# Patient Record
Sex: Male | Born: 2000 | Race: Black or African American | Hispanic: No | Marital: Single | Smoking: Never smoker
Health system: Southern US, Community
[De-identification: ages and names within clinical notes are randomized; demographics above are authoritative.]

## PROBLEM LIST (undated history)

## (undated) DIAGNOSIS — F909 Attention-deficit hyperactivity disorder, unspecified type: Secondary | ICD-10-CM

## (undated) DIAGNOSIS — S069X9A Unspecified intracranial injury with loss of consciousness of unspecified duration, initial encounter: Secondary | ICD-10-CM

## (undated) DIAGNOSIS — R531 Weakness: Secondary | ICD-10-CM

## (undated) DIAGNOSIS — S069XAA Unspecified intracranial injury with loss of consciousness status unknown, initial encounter: Secondary | ICD-10-CM

## (undated) DIAGNOSIS — S0990XS Unspecified injury of head, sequela: Secondary | ICD-10-CM

## (undated) DIAGNOSIS — R569 Unspecified convulsions: Secondary | ICD-10-CM

## (undated) DIAGNOSIS — G8190 Hemiplegia, unspecified affecting unspecified side: Secondary | ICD-10-CM

## (undated) HISTORY — DX: Hemiplegia, unspecified affecting unspecified side: S09.90XS

## (undated) HISTORY — DX: Hemiplegia, unspecified affecting unspecified side: G81.90

## (undated) HISTORY — PX: EYE SURGERY: SHX253

## (undated) HISTORY — DX: Attention-deficit hyperactivity disorder, unspecified type: F90.9

## (undated) HISTORY — PX: STRABISMUS SURGERY: SHX218

## (undated) HISTORY — DX: Unspecified injury of head, sequela: S09.90XS

---
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
## 2019-01-21 ENCOUNTER — Ambulatory Visit (INDEPENDENT_AMBULATORY_CARE_PROVIDER_SITE_OTHER): Payer: BC Managed Care – PPO | Admitting: Mental Health

## 2019-01-21 DIAGNOSIS — F0281 Dementia in other diseases classified elsewhere with behavioral disturbance: Secondary | ICD-10-CM

## 2019-01-21 DIAGNOSIS — F908 Attention-deficit hyperactivity disorder, other type: Secondary | ICD-10-CM

## 2019-01-21 DIAGNOSIS — S069X9A Unspecified intracranial injury with loss of consciousness of unspecified duration, initial encounter: Secondary | ICD-10-CM

## 2019-01-21 DIAGNOSIS — IMO0001 Reserved for inherently not codable concepts without codable children: Secondary | ICD-10-CM

## 2019-01-21 NOTE — Progress Notes (Signed)
[facility] Counselor Initial Adult Exam  Name: Jonathan Moody Date: 01/21/2019 MRN: [id number] DOB: [birth year] PCP: Dahlia Byes, MD  Time spent: 55 minutes Guardian: Adoptive parents: Gavin Pound - mother;   Virtual Visit via Telephone Note Connected with patient by a video enabled telemedicine/telehealth application or telephone, with their informed consent, and verified patient privacy and that I am speaking with the correct person using two identifiers. I discussed the limitations, risks, security and privacy concerns of performing psychotherapy and management service by telephone and the availability of in person appointments. I also discussed with the patient that there may be a patient responsible charge related to this service. The patient expressed understanding and agreed to proceed. I discussed the treatment planning with the patient. The patient was provided an opportunity to ask questions and all were answered. The patient agreed with the plan and demonstrated an understanding of the instructions. The patient was advised to call  our office if  symptoms worsen or feel they are in a crisis state and need immediate contact.  Therapist Location: home Patient Location: home  Start time: 3:00pm Stop time: 3:55pm   Reason for Visit /Presenting Problem:  Mother -Gavin Pound participated in this assessment via telephone. Pt has a hx of speech, occupational, and PT. TBI since age 45 months.   Per record review, Pt had presented to the ED for suicidal ideation in the context of command auditory hallucinations. Mother denies observing Pt having AVH.   Pt has "problems w/ personal space", mother stated he is age 18, but on a 1st grade reading level. Pt can communicate, may lack some abstraction. Becoming interested in girls.  Parents want him to improve social skills and improve judgement / decision making. Eg. He may wander away from them, or the home; she wonders if this is just going to an  ongoing  Problem due to his cognitive functioning. Pt struggles w/ any schedule change (related to TBI/Dev Delay). He has a girl friend in his Conservation officer, historic buildings (at [employer]), they are no longer friends and this has upset Pt. This was about a month or so ago. Pt to receive a Certificate of Completion. Pt is reactive to being surprised- if someone touches him, he gets upset.  Pt did not comment during assessment. Info all provided by mother.  Mental Status Exam:   Appearance:   UTA (unable to assess)  Behavior:  UTA  Motor:  UTA  Speech/Language:   UTA; Delayed per mother  Affect:  UTA  Mood:  UTA; "Calm"  (per mother)  Thought process:  UTA  Thought content:    UTA  Sensory/Perceptual disturbances:    UTA; Denied by guardian   Orientation:  UTA  Attention:  UTA  Concentration:  poor  Memory:  Short /long term intact per mother  Fund of knowledge:   Poor  Insight:    poor  Judgment:   Poor  Impulse Control:  Poor   Reported Symptoms:   Impulsivity, distractible, concentration deficits, social deficits, communication deficits, sleep disturbance  Risk Assessment: Danger to Self:  No Self-injurious Behavior: No Danger to Others: No Duty to Warn:no Physical Aggression / Violence:No  Access to Firearms a concern: No  Gang Involvement:No  Patient / guardian was educated about steps to take if suicide or homicide risk level increases between visits:  While future psychiatric events cannot be accurately predicted, the patient does not currently require acute inpatient psychiatric care and does not currently meet [facility] involuntary commitment criteria.  Substance Abuse History: Current substance abuse: none  Family History:  Family History  Problem Relation Age of Onset  . Epilepsy Maternal Grandfather    Medical History/Surgical History: Past Medical History:  Diagnosis Date  . ADHD (attention deficit hyperactivity disorder)   . Left-sided weakness   . Seizure  ([facility])   . TBI (traumatic brain injury) [facility])     Past Surgical History:  Procedure Laterality Date  . EYE SURGERY      Medications: Current Outpatient Medications  Medication Sig Dispense Refill  . Docusate Sodium (STOOL SOFTENER) 100 MG capsule Take 100 mg by mouth 2 (two) times daily as needed for constipation.    . LamoTRIgine (LAMICTAL XR) 25 MG TB24 24 hour tablet Take 1 tablet total 25 mg by mouth at bedtime for 2 weeks then 2 tablets total 50 mg at bedtime thereafter 50 tablet 1  . lisdexamfetamine (VYVANSE) 60 MG capsule Take 60 mg by mouth daily.    . Multiple Vitamins-Minerals (MULTIVITAMIN ADULT PO) Take 1 tablet by mouth daily.     No current facility-administered medications for this visit.     No Known Allergies  Diagnoses:    ICD-10-CM   1. Attention deficit hyperactivity disorder (ADHD), other type F90.8   2. 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  Patient to continue to engage in individual counseling 2-4 times a month or as needed. 2.  Patient to complete assessment next session. 3.  Patient to contact this office, go to the local ED or call 911 if a crisis or emergency develops between visits.   Waldron Session, [facility]

## 2019-01-27 ENCOUNTER — Ambulatory Visit (INDEPENDENT_AMBULATORY_CARE_PROVIDER_SITE_OTHER): Payer: BC Managed Care – PPO | Admitting: Psychiatry

## 2019-01-27 ENCOUNTER — Other Ambulatory Visit: Payer: Self-pay

## 2019-01-27 ENCOUNTER — Encounter: Payer: Self-pay | Admitting: Psychiatry

## 2019-01-27 DIAGNOSIS — F0281 Dementia in other diseases classified elsewhere with behavioral disturbance: Secondary | ICD-10-CM

## 2019-01-27 DIAGNOSIS — F908 Attention-deficit hyperactivity disorder, other type: Secondary | ICD-10-CM

## 2019-01-27 DIAGNOSIS — S069X9D Unspecified intracranial injury with loss of consciousness of unspecified duration, subsequent encounter: Secondary | ICD-10-CM

## 2019-01-27 DIAGNOSIS — IMO0001 Reserved for inherently not codable concepts without codable children: Secondary | ICD-10-CM

## 2019-01-27 MED ORDER — LAMOTRIGINE ER 100 MG PO TB24: 100.0000 mg | ORAL_TABLET | Freq: Every day | ORAL | 1 refills | Status: DC
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
## 2019-03-27 ENCOUNTER — Other Ambulatory Visit: Payer: Self-pay | Admitting: Psychiatry

## 2019-03-27 DIAGNOSIS — F0281 Dementia in other diseases classified elsewhere with behavioral disturbance: Secondary | ICD-10-CM

## 2019-03-27 DIAGNOSIS — IMO0001 Reserved for inherently not codable concepts without codable children: Secondary | ICD-10-CM

## 2019-03-27 DIAGNOSIS — F908 Attention-deficit hyperactivity disorder, other type: Secondary | ICD-10-CM

## 2019-05-22 ENCOUNTER — Other Ambulatory Visit: Payer: Self-pay | Admitting: Psychiatry

## 2019-05-22 DIAGNOSIS — F908 Attention-deficit hyperactivity disorder, other type: Secondary | ICD-10-CM

## 2019-05-22 DIAGNOSIS — IMO0001 Reserved for inherently not codable concepts without codable children: Secondary | ICD-10-CM

## 2019-05-22 DIAGNOSIS — F0281 Dementia in other diseases classified elsewhere with behavioral disturbance: Secondary | ICD-10-CM

## 2019-05-24 NOTE — Telephone Encounter (Signed)
Neurocognitive disorder with intellectual disability cared for by grandparents all ambivalent at 2nd appointment in June about his treatment but allowing increased dose and apparent improvement warranting their request for refills reminding 2 months overdue for office follow-up on 30-day supply with [facility] [address]

## 2019-05-24 NOTE — Telephone Encounter (Signed)
Last visit 01/2019

## 2019-06-27 ENCOUNTER — Other Ambulatory Visit: Payer: Self-pay | Admitting: Psychiatry

## 2019-06-27 DIAGNOSIS — IMO0001 Reserved for inherently not codable concepts without codable children: Secondary | ICD-10-CM

## 2019-06-27 DIAGNOSIS — F908 Attention-deficit hyperactivity disorder, other type: Secondary | ICD-10-CM

## 2019-06-27 DIAGNOSIS — F0281 Dementia in other diseases classified elsewhere with behavioral disturbance: Secondary | ICD-10-CM

## 2019-06-29 NOTE — Telephone Encounter (Signed)
Now 3 months overdue for follow-up from 74-month appointment planned after April appointment as they request to continue the Lamictal 100 mg every morning sent as #30 with no refill to [facility] [address] medically necessary with no contraindication as of last appointment reminder requested.

## 2019-06-29 NOTE — Telephone Encounter (Signed)
Still hasn't scheduled appt

## 2019-07-27 ENCOUNTER — Other Ambulatory Visit: Payer: Self-pay | Admitting: Psychiatry

## 2019-07-27 DIAGNOSIS — S069X9D Unspecified intracranial injury with loss of consciousness of unspecified duration, subsequent encounter: Secondary | ICD-10-CM

## 2019-07-27 DIAGNOSIS — F908 Attention-deficit hyperactivity disorder, other type: Secondary | ICD-10-CM

## 2019-07-27 DIAGNOSIS — F0281 Dementia in other diseases classified elsewhere with behavioral disturbance: Secondary | ICD-10-CM

## 2019-07-27 DIAGNOSIS — IMO0001 Reserved for inherently not codable concepts without codable children: Secondary | ICD-10-CM

## 2019-07-27 NOTE — Telephone Encounter (Signed)
appt 01/27/2019 nothing scheduled

## 2019-07-27 NOTE — Telephone Encounter (Signed)
4 months overdue for follow-up, the patient would appear to have been off the medication for a couple of months and likely restarted Lamictal as they continue to request fills without appointment, reminders not helping.  Quantity is reduced as they must either use the medication under agreed-upon parameters of care or secure provider or interval in which they can become compliant, sent as #20 tablets of the Lamictal 100 mg daily
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
## 2019-08-04 ENCOUNTER — Other Ambulatory Visit: Payer: Self-pay

## 2019-08-04 DIAGNOSIS — F908 Attention-deficit hyperactivity disorder, other type: Secondary | ICD-10-CM

## 2019-08-04 DIAGNOSIS — IMO0001 Reserved for inherently not codable concepts without codable children: Secondary | ICD-10-CM

## 2019-08-04 DIAGNOSIS — F0281 Dementia in other diseases classified elsewhere with behavioral disturbance: Secondary | ICD-10-CM

## 2019-08-04 MED ORDER — LAMOTRIGINE ER 100 MG PO TB24: 100.0000 mg | ORAL_TABLET | Freq: Every day | ORAL | 0 refills | Status: DC

## 2019-08-04 NOTE — Telephone Encounter (Signed)
Phone call can reach adoptive mother patient having neurocognitive disorder for which Lamictal has reportedly been beneficial now 4 months overdue for follow-up agreed-upon. Pharmacy requests multiple repeated refills without monitoring of use medically.  He did have Vyvanse dispensed 07/21/2019, and we discuss today whether Dr. Dennard Schaumann might also monitor and prescribe his Lamictal. Mother does not want change suggesting she might allow a virtual appointment or even consider an onsite appointment having 1 of each in the past but none for 6 months.  [facility]-day [facility] with expectation that appointment can determine medical status and continuance for maintenance schedule if they do not switch the Lamictal over to Dr. Dennard Schaumann.

## 2019-08-12 ENCOUNTER — Ambulatory Visit (INDEPENDENT_AMBULATORY_CARE_PROVIDER_SITE_OTHER): Payer: BC Managed Care – PPO | Admitting: Psychiatry

## 2019-08-12 ENCOUNTER — Encounter: Payer: Self-pay | Admitting: Psychiatry

## 2019-08-12 ENCOUNTER — Other Ambulatory Visit: Payer: Self-pay

## 2019-08-12 VITALS — Ht 70.5 in | Wt 154.0 lb

## 2019-08-12 DIAGNOSIS — F900 Attention-deficit hyperactivity disorder, predominantly inattentive type: Secondary | ICD-10-CM | POA: Diagnosis not present

## 2019-08-12 DIAGNOSIS — F0281 Dementia in other diseases classified elsewhere with behavioral disturbance: Secondary | ICD-10-CM | POA: Diagnosis not present

## 2019-08-12 DIAGNOSIS — F908 Attention-deficit hyperactivity disorder, other type: Secondary | ICD-10-CM

## 2019-08-12 DIAGNOSIS — IMO0001 Reserved for inherently not codable concepts without codable children: Secondary | ICD-10-CM

## 2019-08-12 MED ORDER — LAMOTRIGINE ER 100 MG PO TB24: 100.0000 mg | ORAL_TABLET | Freq: Every day | ORAL | 5 refills | Status: DC
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
## 2019-09-10 ENCOUNTER — Telehealth: Payer: Self-pay | Admitting: Psychiatry

## 2019-09-10 NOTE — Telephone Encounter (Signed)
Mom, Hilda Blades, called wanting to discuss medication.  She is following up from conversation about medication at last appt.  Please call [phone])

## 2019-09-10 NOTE — Telephone Encounter (Signed)
Neoma Laming as mother phones that patient is aging out of pediatric practice so that pediatrician suggest that the Vyvanse 60 mg be through this office.  Mother just wants to confirm that she can request a monthly supply of [facility] when it is time for that planning to let the office know herself.

## 2019-09-15 ENCOUNTER — Other Ambulatory Visit: Payer: Self-pay | Admitting: Psychiatry

## 2019-09-15 DIAGNOSIS — F900 Attention-deficit hyperactivity disorder, predominantly inattentive type: Secondary | ICD-10-CM

## 2019-09-15 MED ORDER — LISDEXAMFETAMINE DIMESYLATE 60 MG PO CAPS: 60.0000 mg | ORAL_CAPSULE | Freq: Every day | ORAL | 0 refills | Status: DC

## 2019-09-15 NOTE — Telephone Encounter (Signed)
2 days before Thanksgiving now the [facility] registry documents next Vyvanse filled is due 09/18/2019 sent today due to the holiday medically necessary no contraindication

## 2019-09-15 NOTE — Telephone Encounter (Signed)
Mom Jonathan Moody called stating she would like the Vyvanse 60mg  filled at the [facility] per previous conversation with Dr.Jennings. Last appt 10/21s

## 2019-09-15 NOTE — Telephone Encounter (Signed)
Primary care requests transfer of Vyvanse E scribing to this office as patient ages out there sending Vyvanse 60 mg every morning #30 with [facility] refill [facility] medically necessary no contraindication

## 2019-10-19 ENCOUNTER — Telehealth: Payer: Self-pay | Admitting: Psychiatry

## 2019-10-19 ENCOUNTER — Other Ambulatory Visit: Payer: Self-pay

## 2019-10-19 DIAGNOSIS — F900 Attention-deficit hyperactivity disorder, predominantly inattentive type: Secondary | ICD-10-CM

## 2019-10-19 MED ORDER — LISDEXAMFETAMINE DIMESYLATE 60 MG PO CAPS: 60.0000 mg | ORAL_CAPSULE | Freq: Every day | ORAL | 0 refills | Status: DC

## 2019-10-19 NOTE — Telephone Encounter (Signed)
Last refill 09/19/2019 Pended for approval by Dr. Creig Hines Due back in April 2021 for 6 month follow up

## 2019-10-19 NOTE — Telephone Encounter (Signed)
At last appointment 08/19/2019, they did address Lamictal as well as Vyvanse phoning back 11/19 that patient was aging out of Dr. Charisse March practice sending E scription for Vyvanse first on 11/24 dispensed per [place] registry on 11/28 now due the next fill [facility] of the 60 mg Vyvanse medically necessary no contraindication

## 2019-10-19 NOTE — Telephone Encounter (Signed)
Pt mom requested refill for Vyvance @ [facility]. Running behind on requesting due to holiday and office not opened.

## 2019-11-16 ENCOUNTER — Telehealth: Payer: Self-pay | Admitting: Psychiatry

## 2019-11-16 DIAGNOSIS — F900 Attention-deficit hyperactivity disorder, predominantly inattentive type: Secondary | ICD-10-CM

## 2019-11-16 MED ORDER — LISDEXAMFETAMINE DIMESYLATE 60 MG PO CAPS: 60.0000 mg | ORAL_CAPSULE | Freq: Every day | ORAL | 0 refills | Status: DC

## 2019-11-16 NOTE — Telephone Encounter (Signed)
Last appointment 08/12/2019 needing in the interim to transfer Vyvanse refills from his pediatrician to his current care here last dispensed per [facility] registry 10/19/[facility] [address] sent again as #30 for January 27 today medically necessary no contraindication.

## 2019-11-16 NOTE — Telephone Encounter (Signed)
Patient's om called and said that he needs a refill on his vyvanse 60 mg [facility] [address]

## 2019-12-16 ENCOUNTER — Other Ambulatory Visit: Payer: Self-pay

## 2019-12-16 ENCOUNTER — Telehealth: Payer: Self-pay | Admitting: Psychiatry

## 2019-12-16 DIAGNOSIS — F900 Attention-deficit hyperactivity disorder, predominantly inattentive type: Secondary | ICD-10-CM

## 2019-12-16 MED ORDER — LISDEXAMFETAMINE DIMESYLATE 60 MG PO CAPS: 60.0000 mg | ORAL_CAPSULE | Freq: Every day | ORAL | 0 refills | Status: DC

## 2019-12-16 NOTE — Telephone Encounter (Signed)
Pt's mother called and stated Jonathan Moody needs refill on Vyvanse. [facility] [address]

## 2019-12-16 NOTE — Telephone Encounter (Signed)
Last refill 11/19/2019, pended for Dr. Marlyne Beards to submit. Last apt 08/12/2019

## 2020-01-08 ENCOUNTER — Other Ambulatory Visit: Payer: Self-pay | Admitting: Psychiatry

## 2020-01-08 DIAGNOSIS — IMO0001 Reserved for inherently not codable concepts without codable children: Secondary | ICD-10-CM

## 2020-01-08 DIAGNOSIS — F908 Attention-deficit hyperactivity disorder, other type: Secondary | ICD-10-CM

## 2020-01-08 DIAGNOSIS — F0281 Dementia in other diseases classified elsewhere with behavioral disturbance: Secondary | ICD-10-CM

## 2020-01-14 ENCOUNTER — Other Ambulatory Visit: Payer: Self-pay

## 2020-01-14 ENCOUNTER — Telehealth: Payer: Self-pay | Admitting: Psychiatry

## 2020-01-14 DIAGNOSIS — F900 Attention-deficit hyperactivity disorder, predominantly inattentive type: Secondary | ICD-10-CM

## 2020-01-14 MED ORDER — LISDEXAMFETAMINE DIMESYLATE 60 MG PO CAPS: 60.0000 mg | ORAL_CAPSULE | Freq: Every day | ORAL | 0 refills | Status: DC

## 2020-01-14 NOTE — Telephone Encounter (Signed)
Last refill 12/19/2019, pended for Dr. Marlyne Beards with appropriate dates.

## 2020-01-14 NOTE — Telephone Encounter (Signed)
[name] called on Jonathan Moody's behalf requesting a refill on his Vyvanse. [facility] [address] She stated he has about 5 days left, but didn't want to run completely out. Will call in the next 2wks to schedule an appt due to school schedule.

## 2020-01-27 ENCOUNTER — Telehealth: Payer: Self-pay | Admitting: Psychiatry

## 2020-01-27 NOTE — Telephone Encounter (Signed)
Prepared on letterhead as requested for pickup with consent with processing

## 2020-01-27 NOTE — Telephone Encounter (Signed)
Patient's mom called and said that she needs a letter for the school stating what medicines he is taking to update his file. Please call mom for pick up [phone]

## 2020-02-10 ENCOUNTER — Ambulatory Visit (INDEPENDENT_AMBULATORY_CARE_PROVIDER_SITE_OTHER): Payer: BC Managed Care – PPO | Admitting: Psychiatry

## 2020-02-10 ENCOUNTER — Other Ambulatory Visit: Payer: Self-pay

## 2020-02-10 ENCOUNTER — Encounter: Payer: Self-pay | Admitting: Psychiatry

## 2020-02-10 ENCOUNTER — Ambulatory Visit: Payer: BC Managed Care – PPO | Admitting: Psychiatry

## 2020-02-10 VITALS — Ht 70.5 in | Wt 153.0 lb

## 2020-02-10 DIAGNOSIS — F900 Attention-deficit hyperactivity disorder, predominantly inattentive type: Secondary | ICD-10-CM

## 2020-02-10 DIAGNOSIS — IMO0001 Reserved for inherently not codable concepts without codable children: Secondary | ICD-10-CM

## 2020-02-10 DIAGNOSIS — F0281 Dementia in other diseases classified elsewhere with behavioral disturbance: Secondary | ICD-10-CM | POA: Diagnosis not present

## 2020-02-10 MED ORDER — LISDEXAMFETAMINE DIMESYLATE 60 MG PO CAPS: 60.0000 mg | ORAL_CAPSULE | Freq: Every day | ORAL | 0 refills | Status: DC
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
## 2020-03-15 ENCOUNTER — Telehealth: Payer: Self-pay | Admitting: Psychiatry

## 2020-03-15 NOTE — Telephone Encounter (Signed)
Phone call returned to guardian that Vyvanse 60 mg eScription from April 21 appointment is available as #30 on May 27 and #30 on June 26 explaining that process again so she will feel confident the pharmacy has the escript to fill.

## 2020-03-15 NOTE — Telephone Encounter (Signed)
Requesting refill on Vyvanse 60 MG. Please send to [facility] [address]

## 2020-04-07 ENCOUNTER — Other Ambulatory Visit: Payer: Self-pay | Admitting: Psychiatry

## 2020-04-07 DIAGNOSIS — F908 Attention-deficit hyperactivity disorder, other type: Secondary | ICD-10-CM

## 2020-04-07 DIAGNOSIS — IMO0001 Reserved for inherently not codable concepts without codable children: Secondary | ICD-10-CM

## 2020-04-20 DIAGNOSIS — H53022 Refractive amblyopia, left eye: Secondary | ICD-10-CM | POA: Insufficient documentation

## 2020-05-06 ENCOUNTER — Other Ambulatory Visit: Payer: Self-pay | Admitting: Psychiatry

## 2020-05-06 DIAGNOSIS — F900 Attention-deficit hyperactivity disorder, predominantly inattentive type: Secondary | ICD-10-CM

## 2020-05-06 DIAGNOSIS — IMO0001 Reserved for inherently not codable concepts without codable children: Secondary | ICD-10-CM

## 2020-05-06 DIAGNOSIS — F908 Attention-deficit hyperactivity disorder, other type: Secondary | ICD-10-CM

## 2020-05-06 NOTE — Telephone Encounter (Signed)
Jonathan Moody, mom, called to request a written prescription of [name] Vyvanse and his Lamictal. He has been switch to medicare disability insurance so now the cost of the medication is high.  She is trying to shop the prices so she can get the best rate.  Since she doesn't know which pharmacy they will be using it can't be escribed.  She just needs 1 prescription for each because once she determines which pharmacy she will have future prescriptions escribed.  I asked her to inquire of the pharmacy if a PA would help with the cost of the medication.  She would like to pick up the prescriptions Monday/tuesday.  Call her they are ready [phone]

## 2020-05-09 MED ORDER — LAMOTRIGINE ER 100 MG PO TB24: 100.0000 mg | ORAL_TABLET | Freq: Every day | ORAL | 0 refills | Status: DC

## 2020-05-09 MED ORDER — LISDEXAMFETAMINE DIMESYLATE 60 MG PO CAPS: 60.0000 mg | ORAL_CAPSULE | Freq: Every day | ORAL | 0 refills | Status: DC

## 2020-05-09 NOTE — Telephone Encounter (Signed)
Phone request by patient and mother for written Lamictal and Vyvanse prescriptions as they have changed insurances and need written prescription to go to pharmacies and systems for price quotes, currently due for Vyvanse last dispensing per [place] registry 04/02/2020 as a 30-day supply of each and no refill as they request as a single one-time necessity to then return to the electronic prescribing expected by others finding no contraindication but medically necessary

## 2020-05-11 ENCOUNTER — Telehealth: Payer: Self-pay | Admitting: Psychiatry

## 2020-05-11 NOTE — Telephone Encounter (Signed)
Phone call returned to guardian mother Gavin Pound that the lamotrigine TB 24-hour is the extended release generic that he has been on for 15 months though if she prefers the brand name Lamictal XR I can print that for her did not want her to make a needleless extra trip to office She will double check on this with her pharmacy sources. She also inquires as to whether generic Dexedrine 15 mg XR is the same as Vyvanse  generic and I clarify Vyvanse is different.  She is working with all this to find her best price quote.

## 2020-05-11 NOTE — Telephone Encounter (Signed)
Mom Jonathan Moody stated she picked up a hard copy of Jonathan Moody's Lamotrigine to compare prices due to a change in insurance. The hard copy did not have  XR which she states he is on. The pharmacy stated that the price would differ from the one quoted. She would like to come by tomorrow to get a corrected hard copy Rx.

## 2020-05-19 ENCOUNTER — Telehealth: Payer: Self-pay

## 2020-05-19 NOTE — Telephone Encounter (Signed)
Received a message from [employer] [phone] ext [phone], Ref #[id number] That clinically questions needed to be answered concerning patient. There was not medication indicated in message.  I (nurse) returned call to physician line that Mom had initiated a prior authorization for Vyvanse 60 mg. Informed them I was unaware but was available to answer questions. Just basic clinical questions. Representative submitted everything to pharmacy but did say it's approved and they would fax approval dates.
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
## 2020-05-23 NOTE — Telephone Encounter (Signed)
Received prior authorization request fax from [employer] on patient's LAMOTRIGINE XR 100 MG, questions answered and office notes faxed per request. Pending determination at this time.   P. [phone] F. [phone]

## 2020-05-24 NOTE — Telephone Encounter (Signed)
Coverage determination for LAMOTRIGINE ER 100 MG TABLET Higher tier) was DENIED due to Medicare Part D plan guidelines diagnosis of a medically accepted indication AND 1 formulary alternative that is in a lower tier in the same drug class must be tried and failed prior to approval of this higher tier medication. Physician can explain why the lower tier formulary is not appropriate for the patient if needed.      Also received a TIER EXCEPTION from Medicare Part D [employer] for patient's VYVANSE 60 mg capsule. His prior authorization was previously approved but now request is for a lower tier cost. Forms faxed, waiting on determination at this time.
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
## 2020-05-26 ENCOUNTER — Telehealth: Payer: Self-pay

## 2020-05-26 NOTE — Telephone Encounter (Signed)
[employer] approved a tier reduction for Vyvanse 60 mg capsules effective 05/24/2020-10/21/2020

## 2020-06-14 ENCOUNTER — Telehealth: Payer: Self-pay | Admitting: Psychiatry

## 2020-06-14 ENCOUNTER — Other Ambulatory Visit: Payer: Self-pay | Admitting: Psychiatry

## 2020-06-14 ENCOUNTER — Other Ambulatory Visit: Payer: Self-pay

## 2020-06-14 DIAGNOSIS — IMO0001 Reserved for inherently not codable concepts without codable children: Secondary | ICD-10-CM

## 2020-06-14 DIAGNOSIS — F900 Attention-deficit hyperactivity disorder, predominantly inattentive type: Secondary | ICD-10-CM

## 2020-06-14 DIAGNOSIS — F908 Attention-deficit hyperactivity disorder, other type: Secondary | ICD-10-CM

## 2020-06-14 MED ORDER — LAMOTRIGINE 100 MG PO TABS: 100.0000 mg | ORAL_TABLET | Freq: Every day | ORAL | 0 refills | Status: DC

## 2020-06-14 MED ORDER — LISDEXAMFETAMINE DIMESYLATE 60 MG PO CAPS: 60.0000 mg | ORAL_CAPSULE | Freq: Every day | ORAL | 0 refills | Status: DC

## 2020-06-14 NOTE — Telephone Encounter (Signed)
Pt needs a refill on vyvanse 60 mg and needs a 3 [facility] [address]. He also needs his lamotrigine sent in as well

## 2020-06-14 NOTE — Telephone Encounter (Signed)
Gavin Pound called because since [employer] has denied the lamictal she will need to pay out of pocket for it.  She will need a written prescription to take to the pharmacy with the best price on it.  Please call her to let her know when it is ready.  She also want to talk about whether or not the lamictal is going to be the best for Joash going forward since there are insurance issues.  Please call to discuss if you are going to do an appeal on the denial or change to another medication.

## 2020-06-14 NOTE — Telephone Encounter (Signed)
Rx's pended for Dr. Marlyne Beards to review and send
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
## 2020-06-15 ENCOUNTER — Telehealth: Payer: Self-pay | Admitting: Psychiatry

## 2020-06-15 NOTE — Telephone Encounter (Signed)
Pt mom Debra LM insurance not covering Lamotrigine IR. He only has 2 pills.   Return call ASAP [phone]

## 2020-06-15 NOTE — Telephone Encounter (Signed)
Spoke with pharmacist and she said it's ready for pick up. Please contact her

## 2020-07-08 ENCOUNTER — Other Ambulatory Visit: Payer: Self-pay | Admitting: Psychiatry

## 2020-07-08 DIAGNOSIS — F900 Attention-deficit hyperactivity disorder, predominantly inattentive type: Secondary | ICD-10-CM

## 2020-07-08 DIAGNOSIS — IMO0001 Reserved for inherently not codable concepts without codable children: Secondary | ICD-10-CM

## 2020-07-08 NOTE — Telephone Encounter (Signed)
90 day request now

## 2020-07-15 ENCOUNTER — Other Ambulatory Visit: Payer: Self-pay

## 2020-07-15 ENCOUNTER — Telehealth: Payer: Self-pay | Admitting: Psychiatry

## 2020-07-15 DIAGNOSIS — F900 Attention-deficit hyperactivity disorder, predominantly inattentive type: Secondary | ICD-10-CM

## 2020-07-15 MED ORDER — LISDEXAMFETAMINE DIMESYLATE 60 MG PO CAPS: 60.0000 mg | ORAL_CAPSULE | Freq: Every day | ORAL | 0 refills | Status: DC

## 2020-07-15 NOTE — Telephone Encounter (Signed)
Pended for Dr. Marlyne Beards  Last refill 06/15/2020

## 2020-07-15 NOTE — Telephone Encounter (Signed)
Pt mom Deb called requesting refill on Vyvanse 60 mg @ [facility]. Was told he had refills on file last month and did not have after August Rx. Misunderstood refill process and concerned about meds for school Monday.  Follow up due OCT.

## 2020-07-15 NOTE — Telephone Encounter (Signed)
Jonathan Moody called to request the Vyvanse refill. [facility]. Last appt 4/21

## 2020-07-15 NOTE — Telephone Encounter (Signed)
Though guardian grandmother phones that there is no refill on Vyvanse as though she is not familiar with fills and refills, we can only replace as in the past the Vyvanse 1 fill at a time at a time when they call instead of coming for appointment.  Vyvanse 60 mg every morning #30 is [facility] finding no contraindication by epic or [place] registry in the last year.

## 2020-08-10 ENCOUNTER — Encounter: Payer: Self-pay | Admitting: Psychiatry

## 2020-08-15 ENCOUNTER — Telehealth: Payer: Self-pay | Admitting: Psychiatry

## 2020-08-15 DIAGNOSIS — F900 Attention-deficit hyperactivity disorder, predominantly inattentive type: Secondary | ICD-10-CM

## 2020-08-15 MED ORDER — LISDEXAMFETAMINE DIMESYLATE 60 MG PO CAPS: 60.0000 mg | ORAL_CAPSULE | Freq: Every day | ORAL | 0 refills | Status: DC

## 2020-08-15 NOTE — Telephone Encounter (Signed)
Though appoimtment for follow up here tomorrow, family is out of Vyvnase 60 mg for getting patient here sent as #[facility] [address] medically necessary no contraindication in the last year.

## 2020-08-15 NOTE — Telephone Encounter (Signed)
Requesting Rx for Vyvanse, does have apt tomorrow 08/16/20

## 2020-08-15 NOTE — Telephone Encounter (Signed)
Pt out of refills for Vyvanse.Stanton Kidney mom called to request refills for pt. Advised follow up due. [address] [facility] [address]

## 2020-08-16 ENCOUNTER — Other Ambulatory Visit: Payer: Self-pay

## 2020-08-16 ENCOUNTER — Ambulatory Visit (INDEPENDENT_AMBULATORY_CARE_PROVIDER_SITE_OTHER): Payer: PPO | Admitting: Psychiatry

## 2020-08-16 ENCOUNTER — Encounter: Payer: Self-pay | Admitting: Psychiatry

## 2020-08-16 VITALS — Ht 70.5 in | Wt 157.0 lb

## 2020-08-16 DIAGNOSIS — IMO0001 Reserved for inherently not codable concepts without codable children: Secondary | ICD-10-CM

## 2020-08-16 DIAGNOSIS — F0281 Dementia in other diseases classified elsewhere with behavioral disturbance: Secondary | ICD-10-CM | POA: Diagnosis not present

## 2020-08-16 DIAGNOSIS — F900 Attention-deficit hyperactivity disorder, predominantly inattentive type: Secondary | ICD-10-CM | POA: Diagnosis not present

## 2020-08-16 MED ORDER — LISDEXAMFETAMINE DIMESYLATE 60 MG PO CAPS: 60.0000 mg | ORAL_CAPSULE | Freq: Every day | ORAL | 0 refills | Status: DC

## 2020-08-16 MED ORDER — LAMOTRIGINE 100 MG PO TABS: 100.0000 mg | ORAL_TABLET | Freq: Every day | ORAL | 1 refills | Status: DC
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
## 2020-12-12 ENCOUNTER — Telehealth: Payer: Self-pay | Admitting: Psychiatry

## 2020-12-12 DIAGNOSIS — IMO0001 Reserved for inherently not codable concepts without codable children: Secondary | ICD-10-CM

## 2020-12-12 DIAGNOSIS — S069X9S Unspecified intracranial injury with loss of consciousness of unspecified duration, sequela: Secondary | ICD-10-CM

## 2020-12-12 DIAGNOSIS — F900 Attention-deficit hyperactivity disorder, predominantly inattentive type: Secondary | ICD-10-CM

## 2020-12-12 MED ORDER — LAMOTRIGINE 100 MG PO TABS: 100.0000 mg | ORAL_TABLET | Freq: Every day | ORAL | 1 refills | Status: DC

## 2020-12-12 NOTE — Telephone Encounter (Signed)
Patient called in requesting refill for Lamotrigine states he only has two pills left. He is a former patient of Dr. Marlyne Beards and has an appt 4/26. [facility] [address] [place]

## 2020-12-13 MED ORDER — LISDEXAMFETAMINE DIMESYLATE 60 MG PO CAPS: 60.0000 mg | ORAL_CAPSULE | Freq: Every day | ORAL | 0 refills | Status: DC

## 2020-12-13 NOTE — Telephone Encounter (Signed)
Patient mom Jonathan Moody called on yesterday. The refill was for Vyvanse 60 mg not the Lamotrigine. Jonathan Moody is a former patient of Dr Marlyne Beards due to see Shanda Bumps 4/26. [facility] [address]

## 2020-12-13 NOTE — Telephone Encounter (Signed)
I spoke to his mother,she has been informed.

## 2021-02-14 ENCOUNTER — Other Ambulatory Visit: Payer: Self-pay

## 2021-02-14 ENCOUNTER — Ambulatory Visit (INDEPENDENT_AMBULATORY_CARE_PROVIDER_SITE_OTHER): Payer: PPO | Admitting: Psychiatry

## 2021-02-14 ENCOUNTER — Encounter: Payer: Self-pay | Admitting: Psychiatry

## 2021-02-14 DIAGNOSIS — IMO0001 Reserved for inherently not codable concepts without codable children: Secondary | ICD-10-CM

## 2021-02-14 DIAGNOSIS — F0281 Dementia in other diseases classified elsewhere with behavioral disturbance: Secondary | ICD-10-CM

## 2021-02-14 DIAGNOSIS — F900 Attention-deficit hyperactivity disorder, predominantly inattentive type: Secondary | ICD-10-CM

## 2021-02-14 MED ORDER — LISDEXAMFETAMINE DIMESYLATE 60 MG PO CAPS: 60.0000 mg | ORAL_CAPSULE | Freq: Every day | ORAL | 0 refills | Status: DC

## 2021-02-14 MED ORDER — LAMOTRIGINE 100 MG PO TABS: 100.0000 mg | ORAL_TABLET | Freq: Every day | ORAL | 1 refills | Status: DC

## 2021-02-14 NOTE — Progress Notes (Signed)
Jonathan MATSUO [id number] October 29, 2000 20 y.o.  Subjective:   Patient ID:  Jonathan Moody is a 20 y.o. (DOB [birth year]) male.  Chief Complaint:  Chief Complaint  Patient presents with  . ADHD    HPI Jonathan Moody presents to the office today for follow-up of ADHD and major neurocognitive disorder.  He is accompanied by adopted mother (biological grandmother). She reports that he has been doing well.  He has a girlfriend, Gladys Damme, at camp and will be able to see her again in June. He is in school at [employer] and is about to graduate.   He describes his mood as "in between." Denies depression. They report he has not had any irritability or anger.  He denies worry. His adopted mother reports he sleeps 8-9 hours a night. He reports that his appetite has been ok. He reports that his energy is ok. He reports adequate concentration. Denies SI.   He plays basketball every Saturday and enjoys this. He likes watching NBA basketball. Lives with adopted mother and father. His cousin has been staying with them some of the time.   Past Psychiatric Medication Trials: Vyvanse Lamictal    Flowsheet Row ED from 08/10/2018 in [facility]  C-SSRS RISK CATEGORY High Risk       Review of Systems:  Review of Systems  Cardiovascular: Negative for palpitations.  Musculoskeletal: Negative for gait problem.  Psychiatric/Behavioral:       Please refer to HPI    Medications: I have reviewed the patient's current medications.  Current Outpatient Medications  Medication Sig Dispense Refill  . Docusate Sodium 100 MG capsule Take 100 mg by mouth 2 (two) times daily as needed for constipation.    Marland Kitchen lisdexamfetamine (VYVANSE) 60 MG capsule Take 1 capsule (60 mg total) by mouth daily after breakfast. 30 capsule 0  . Multiple Vitamins-Minerals (MULTIVITAMIN ADULT PO) Take 1 tablet by mouth daily.    Marland Kitchen lamoTRIgine (LAMICTAL) 100 MG tablet Take 1 tablet  (100 mg total) by mouth daily after breakfast. 90 tablet 1  . [START ON 05/04/2021] lisdexamfetamine (VYVANSE) 60 MG capsule Take 1 capsule (60 mg total) by mouth daily after breakfast. 30 capsule 0  . [START ON 04/06/2021] lisdexamfetamine (VYVANSE) 60 MG capsule Take 1 capsule (60 mg total) by mouth daily after breakfast. 30 capsule 0  . [START ON 03/09/2021] lisdexamfetamine (VYVANSE) 60 MG capsule Take 1 capsule (60 mg total) by mouth daily after breakfast. 30 capsule 0   No current facility-administered medications for this visit.    Medication Side Effects: None  Allergies: No Known Allergies  Past Medical History:  Diagnosis Date  . ADHD (attention deficit hyperactivity disorder)   . Left-sided weakness   . Seizure (HCC)   . TBI (traumatic brain injury) [facility])     Past Medical History, Surgical history, Social history, and Family history were reviewed and updated as appropriate.   Please see review of systems for further details on the patient's review from today.   Objective:   Physical Exam:  BP 135/83   Pulse 88   Wt 174 lb (78.9 kg)   BMI 24.61 kg/m   Physical Exam Constitutional:      General: He is not in acute distress. Musculoskeletal:        General: No deformity.  Neurological:     Mental Status: He is alert and oriented to person, place, and time.     Coordination: Coordination normal.  Psychiatric:  Attention and Perception: Perception normal. He is inattentive. He does not perceive auditory or visual hallucinations.        Mood and Affect: Mood normal. Mood is not anxious or depressed. Affect is not labile, blunt, angry or inappropriate.        Behavior: Behavior is withdrawn. Behavior is cooperative.        Thought Content: Thought content normal. Thought content is not paranoid or delusional. Thought content does not include homicidal or suicidal ideation. Thought content does not include homicidal or suicidal plan.        Cognition and Memory:  Memory normal. Cognition is impaired.     Comments: Insight fair Judgment impaired Perseverates on girlfriend Speech is soft. Decreased quantity. Tangential at times     Lab Review:     Component Value Date/Time   NA 144 08/10/2018 1551   K 3.7 08/10/2018 1551   CL 107 08/10/2018 1551   CO2 29 08/10/2018 1551   GLUCOSE 90 08/10/2018 1551   BUN 15 08/10/2018 1551   CREATININE 1.11 08/10/2018 1551   CALCIUM 9.9 08/10/2018 1551   PROT 8.0 08/10/2018 1551   ALBUMIN 4.6 08/10/2018 1551   AST 24 08/10/2018 1551   ALT 18 08/10/2018 1551   ALKPHOS 119 08/10/2018 1551   BILITOT 0.8 08/10/2018 1551   GFRNONAA >60 08/10/2018 1551   GFRAA >60 08/10/2018 1551       Component Value Date/Time   WBC 5.3 08/10/2018 1551   RBC 5.63 08/10/2018 1551   HGB 13.3 08/10/2018 1551   HCT 44.6 08/10/2018 1551   PLT 208 08/10/2018 1551   MCV 79.2 (L) 08/10/2018 1551   MCH 23.6 (L) 08/10/2018 1551   MCHC 29.8 (L) 08/10/2018 1551   RDW 13.2 08/10/2018 1551    No results found for: POCLITH, LITHIUM   No results found for: PHENYTOIN, PHENOBARB, VALPROATE, CBMZ   .res Assessment: Plan:   Will continue current plan of care since target signs and symptoms are well controlled without any tolerability issues. Continue Vyvanse 60 mg daily for ADHD. Continue Lamictal 100 mg daily for mood s/s.  Pt to follow-up in 6 months or sooner if clinically indicated.  Requested pt call in 3 months to provide update and request additional scripts.   [name] was seen today for adhd.  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 -     lamoTRIgine (LAMICTAL) 100 MG tablet; Take 1 tablet (100 mg total) by mouth daily after breakfast.  Attention deficit hyperactivity disorder (ADHD), inattentive type,  severe -     lisdexamfetamine (VYVANSE) 60 MG capsule; Take 1 capsule (60 mg total) by mouth daily after breakfast. -     lisdexamfetamine (VYVANSE) 60 MG capsule; Take 1 capsule (60 mg total) by mouth daily after breakfast. -     lisdexamfetamine (VYVANSE) 60 MG capsule; Take 1 capsule (60 mg total) by mouth daily after breakfast. -     lamoTRIgine (LAMICTAL) 100 MG tablet; Take 1 tablet (100 mg total) by mouth daily after breakfast.     Please see After Visit Summary for patient specific instructions.  Future Appointments  Date Time Provider Department Center  03/14/2021  3:00 PM Robley Fries, PhD [facility] None  08/15/2021  3:30 PM Corie Chiquito, PMHNP [facility] None  No orders of the defined types were placed in this encounter.   -------------------------------

## 2021-03-14 ENCOUNTER — Ambulatory Visit: Payer: PPO | Admitting: Psychiatry

## 2021-03-14 ENCOUNTER — Other Ambulatory Visit: Payer: Self-pay

## 2021-03-14 ENCOUNTER — Ambulatory Visit (INDEPENDENT_AMBULATORY_CARE_PROVIDER_SITE_OTHER): Payer: PPO | Admitting: Psychiatry

## 2021-03-14 DIAGNOSIS — F900 Attention-deficit hyperactivity disorder, predominantly inattentive type: Secondary | ICD-10-CM

## 2021-03-14 DIAGNOSIS — IMO0001 Reserved for inherently not codable concepts without codable children: Secondary | ICD-10-CM

## 2021-03-14 DIAGNOSIS — F0281 Dementia in other diseases classified elsewhere with behavioral disturbance: Secondary | ICD-10-CM | POA: Diagnosis not present

## 2021-03-14 NOTE — Progress Notes (Signed)
PROBLEM-FOCUSED INITIAL PSYCHOTHERAPY EVALUATION Jonathan Czar, PhD LP [facility]  Name: Jonathan Moody Date: 03/14/2021 Time spent: 55 min MRN: [id number] DOB: [birth year] Guardian/Payee: self  PCP: Dahlia Byes, MD Documentation requested on this visit: No  PROBLEM HISTORY Reason for Visit /Presenting Problem:  Chief Complaint  Patient presents with   Establish Care   Other    Behavior problem  Seen with Jonathan Moody.  Narrative/History of Present Illness Referred by Jonathan Moody of this office.  GM reports need for working with boundaries.  Brain injury at 59 months old.  Needs a lot of repetition to learn things, hx of a range of behavior problems  Reading comes difficult.  Sight spelling OK, and mimicking writing OK, but intentional communication is more at the level of kindergarten.  Has IEP at school.  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  Slated to graduate next year after 3 field placements through [employer] (9-week sessions starting in August).  Has outplacements previously with [employer] and [address]    Says he has a GF, Jonathan Moody (last seen at camp last year).  [employer] in [place] (day camp) this summer, 7th or 8th year.  Boundary issues as seen have to do with letting the male decide about interactions.  No asserted history of forcing anyone into any physical acts, just that he was badly heartbroken and obsessive when a girl at [facility] turned him down...  Teaching him to know ages and not pursue underage (no incidents reported) and be respectful toward girls (no incidents).  Has a phone, has been discovered searching male anatomy and getting sexy pictures, says his search feeds are now steering toward porn on their own.  Jonathan Moody is trying to figure out parental controls on the device through their carrier ([employer]), says she has some controls on home electronics (Xbox).   There was sex ed in school before 8th grade, including orientation to consent and respect.  Some girls can approach him more, at one point they were hugging him freely, and needed some talking-to from school staff.  Still friends with Jonathan Moody, the previous love interest.    As a full-grown black male with no quickly noticed deformities, he could be a target for harassment, profiling, bad reactions, and he knows he doesn't come across obviously mentally limited on sight in the world at large.  Mostly family have been sheltering him best they could.  Available family in the home include Jonathan Moody, grandfather Jonathan Moody, and cousin Jonathan Moody.  Jonathan Moody graduates [employer] soon, anticipates he may be moving out in 6 months.  Just started staying there 3 months ago, after his mother married and relocated out of state ([place]).  The two get along well.  Otherwise, for 20 years it's been the three of them at home.  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  10 years each in 3 systems in [place].  GM says he can be available for mentoring Jonathan Moody.  Individually, limited disclosure from Jonathan Moody.  Clearly simplistic perspective, does not comprehend adult patient's rights, responsibilities, and judgment and will require assistance making treatment and other decisions.  What makes him sad includes missing girlfriend, or if he breaks his phone, or missing friends over the summer while school is out.  Nervous -- used to be scared of dogs, once got scratched up a couple years ago and needed shots,  and he can be nervous leaving school (misses friends).  Angry -- nothing, really, can irritate him, he says.  If a peer tries to pick a fight, maybe, but that doesn't happen any more.  No one in life he feels at risk with.  Confide in -- teachers, Mama ([employer]), Dad (grandfather), Jonathan Moody (biological mother, now out of state, visits).  In school, doesn't take tests.   Does work with the Clinical cytogeneticist at school.  Likes sports -- basketball, used to play baseball little.  Gets to play at school.  Huge NBA fan.  Prior Psychiatric Assessment/Treatment:   Outpatient treatment: no prior therapy known.  Med mgmt with Dr. Marlyne Beards before his retirement, now Jonathan Moody.   Psychiatric hospitalization: none stated Psychological assessment/testing: deferred -- may consult school, or any public services he has received  Abuse/neglect screening: Victim of abuse: Not assessed at this time / none suspected.   Victim of neglect: Not assessed at this time / none suspected.   Perpetrator of abuse/neglect: Not assessed at this time / none suspected.   Witness / Exposure to Domestic Violence: Not assessed at this time / none suspected.   Witness to Community Violence:  Not assessed at this time / none suspected.   Protective Services Involvement: No.   Report needed: No.    Substance abuse screening: Current substance abuse: No.   History of impactful substance use/abuse: No.     FAMILY/SOCIAL HISTORY Family of origin -- as above Family of intention/current living situation -- as above Education -- delayed trajectory, senior in Midwife -- in Systems analyst, working Teacher, English as a foreign language -- Insurance risk surveyor -- deferred Enjoyable activities -- as stated Other situational factors affecting treatment and prognosis: Stressors from the following areas:  impaired development Barriers to service: availability, school day  Notable cultural sensitivities: not applicable  Strengths: Family and Charity fundraiser   MED/SURG HISTORY Med/surg history was not reviewed with PT at this time.  Of note for psychotherapy at this time is neurocognitive impairment. Past Medical History:  Diagnosis Date   ADHD (attention deficit hyperactivity disorder)    Left-sided weakness    Seizure (HCC)    TBI (traumatic brain injury) (HCC)      Past Surgical  History:  Procedure Laterality Date   EYE SURGERY      No Known Allergies  Medications (as listed in Epic): Current Outpatient Medications  Medication Sig Dispense Refill   Docusate Sodium 100 MG capsule Take 100 mg by mouth 2 (two) times daily as needed for constipation.     lamoTRIgine (LAMICTAL) 100 MG tablet Take 1 tablet (100 mg total) by mouth daily after breakfast. 90 tablet 1   lisdexamfetamine (VYVANSE) 60 MG capsule Take 1 capsule (60 mg total) by mouth daily after breakfast. 30 capsule 0   lisdexamfetamine (VYVANSE) 60 MG capsule Take 1 capsule (60 mg total) by mouth daily after breakfast. 30 capsule 0   lisdexamfetamine (VYVANSE) 60 MG capsule Take 1 capsule (60 mg total) by mouth daily after breakfast. 30 capsule 0   lisdexamfetamine (VYVANSE) 60 MG capsule Take 1 capsule (60 mg total) by mouth daily after breakfast. 30 capsule 0   Multiple Vitamins-Minerals (MULTIVITAMIN ADULT PO) Take 1 tablet by mouth daily.     No current facility-administered medications for this visit.    MENTAL STATUS AND OBSERVATIONS Appearance:   Casual and Well Groomed     Behavior:  Appropriate and reticent  Motor:  Hand weakness, leaning gait  Speech/Language:  Fluent, articulation issue  Affect:  Constricted  Mood:  anxious and mildly  Thought process:  concrete and simplistic  Thought content:    grossly intact  Sensory/Perceptual disturbances:    WNL  Orientation:  grossly intact  Attention:  Fair  Concentration:  Fair  Memory:  Basic, intact  Fund of knowledge:   Poor  Insight:    Fair  Judgment:   Fair  Impulse Control:  Fair   Initial Risk Assessment: Danger to self: No Self-injurious behavior: No Danger to others: No Physical aggression / violence: No Duty to warn: No Access to firearms a concern: No Gang involvement: No Patient / guardian was educated about steps to take if suicide or homicide risk level increases between visits: yes While future psychiatric events  cannot be accurately predicted, the patient does not currently require acute inpatient psychiatric care and does not currently meet [facility] involuntary commitment criteria.   DIAGNOSIS:    ICD-10-CM   1. 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 Attention deficit hyperactivity disorder (ADHD), inattentive type, severe  F90.0       INITIAL TREATMENT: Support/validation provided for distressing symptoms and confirmed rapport Ethical orientation and informed consent confirmed re: privacy rights -- including but not limited to HIPAA, EMR and use of e-PHI patient responsibilities -- scheduling, fair notice of changes, in-person vs. telehealth and regulatory and financial conditions affecting choice expectations for working relationship in psychotherapy needs and consents for working partnerships and exchange of information with other health care providers, especially any medication and other behavioral health providers Initial orientation to cognitive-behavioral and solution-focused therapy approach Psychoeducation and initial recommendations: Alerted GM to the fact that I have very little experience trying to treat developmental and major neuro injury patients, but it may be worth assessing further to see if we can improve judgment about social interactions Priority on cleaning his phone -- should seek out knowledgeable help among family, friends, church Outlook for therapy -- scheduling constraints, availability of crisis service, inclusion of family member(s) as appropriate  Plan: Parents to take care of getting the phone cleaned, electronically Continue with any current programs as established and comply with rules and standards of them Open-door policy with GM, as effective guardian.  Include GF as interested. Maintain medication as prescribed and work faithfully with relevant prescriber(s) if  any changes are desired or seem indicated Call the clinic on-call service, present to ER, or call 911 if any life-threatening psychiatric crisis Return 2-4 wks.  Robley Fries, PhD  Jonathan Czar, PhD LP Clinical Psychologist, [facility] [address] [place], [place] [address] [phone]

## 2021-04-12 ENCOUNTER — Ambulatory Visit: Payer: PPO | Admitting: Psychiatry

## 2021-04-14 ENCOUNTER — Other Ambulatory Visit: Payer: Self-pay

## 2021-04-14 ENCOUNTER — Ambulatory Visit (INDEPENDENT_AMBULATORY_CARE_PROVIDER_SITE_OTHER): Payer: PPO | Admitting: Psychiatry

## 2021-04-14 DIAGNOSIS — F0281 Dementia in other diseases classified elsewhere with behavioral disturbance: Secondary | ICD-10-CM

## 2021-04-14 DIAGNOSIS — IMO0001 Reserved for inherently not codable concepts without codable children: Secondary | ICD-10-CM

## 2021-04-14 DIAGNOSIS — F900 Attention-deficit hyperactivity disorder, predominantly inattentive type: Secondary | ICD-10-CM
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
## 2021-05-26 ENCOUNTER — Ambulatory Visit (INDEPENDENT_AMBULATORY_CARE_PROVIDER_SITE_OTHER): Payer: PPO | Admitting: Psychiatry

## 2021-05-26 ENCOUNTER — Other Ambulatory Visit: Payer: Self-pay

## 2021-05-26 DIAGNOSIS — F66 Other sexual disorders: Secondary | ICD-10-CM

## 2021-05-26 DIAGNOSIS — F900 Attention-deficit hyperactivity disorder, predominantly inattentive type: Secondary | ICD-10-CM | POA: Diagnosis not present

## 2021-05-26 DIAGNOSIS — F0281 Dementia in other diseases classified elsewhere with behavioral disturbance: Secondary | ICD-10-CM | POA: Diagnosis not present

## 2021-05-26 DIAGNOSIS — IMO0001 Reserved for inherently not codable concepts without codable children: Secondary | ICD-10-CM

## 2021-05-26 NOTE — Progress Notes (Signed)
Psychotherapy Progress Note [facility] Marliss Czar, PhD LP  Patient ID: ALMIR BOTTS     MRN: [id number] Therapy format: Individual psychotherapy Date: 05/26/2021      Start: 4:10p     Stop: 5:00p     Time Spent: 50 min Location: In-person   Session narrative (presenting needs, interim history, self-report of stressors and symptoms, applications of prior therapy, status changes, and interventions made in session) Going to [employer] this summer, Gladys Damme not part of it.  Enjoying it anyway.  Will be returning to [employer], starting later this month.  Feels good about it.    Still looks for sexy pictures on his phone sometimes, through Google.  Claims the phone does it on its own and he just looks.  With his consent, inspected his phone in session, found he has a porn site showing right now in his Oceanographer.  Further look shows 88 windows open, nearly all of them pornography, 88 porn pages visited this calendar day, easily 200 yesterday.  Figured out that he does not know how to close windows that are open in Google, so showed him how to close them and explained how the phone has all sorts of ways it tries to learn what you do so it can help you by coming back and doing them again.    With permission, went through his phone settings, made adjustments to get his Google account to stop all targeted advertising, clear the cache, delete browsing history to date, and employ highest security browsing.  Briefed grandmother on actions taken, recommended again they work through having his phone checked out more thoroughly by a knowledgeable person in their life, enact parental controls, and ask ISP to enact parental controls on his service and their network to further prevent stumbling back into pornography.  Both agree.  Discussed possibilities for other kinds of fun.  Recommended playing games instead of looking for pictures, says he's willing.     Therapeutic modalities: Cognitive Behavioral Therapy, Solution-Oriented/Positive Psychology, and Psychologist, occupational  Mental Status/Observations:  Appearance:   Casual     Behavior:  Appropriate and quiet  Motor:  grossly intact  Speech/Language:   grossly intact  Affect:  Appropriate  Mood:  normal  Thought process:  concrete  Thought content:    WNL  Sensory/Perceptual disturbances:    grossly intact  Orientation:  grossly intact  Attention:  Fair    Concentration:  Fair  Memory:  grossly intact  Insight:    Fair  Judgment:   Fair  Impulse Control:  Fair   Risk Assessment: Danger to Self: No Self-injurious Behavior: No Danger to Others: No Physical Aggression / Violence: No Duty to Warn: No Access to Firearms a concern: No  Assessment of progress:  progressing  Diagnosis:   ICD-10-CM   1. 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 Attention deficit hyperactivity disorder (ADHD), inattentive type, severe  F90.0     3. Pornography addiction  F66    driven by exposure more than intrinsic motivation     Plan:  Family to clean phone, establish parental controls and Nutritional therapist to turn attention to playing games for fun Other recommendations/advice as may be noted above Continue to utilize previously learned skills ad lib Maintain medication as prescribed and work faithfully with relevant prescriber(s) if any changes are desired or seem  indicated Call the clinic on-call service, present to ER, or call 911 if any life-threatening psychiatric crisis Return for time as available. Already scheduled visit in this office 08/15/2021.  Robley Fries, PhD Marliss Czar, PhD LP Clinical Psychologist, [facility] [address] [place], [place] [address] [phone]

## 2021-06-09 ENCOUNTER — Telehealth: Payer: Self-pay | Admitting: Psychiatry

## 2021-06-09 ENCOUNTER — Other Ambulatory Visit: Payer: Self-pay

## 2021-06-09 DIAGNOSIS — IMO0001 Reserved for inherently not codable concepts without codable children: Secondary | ICD-10-CM

## 2021-06-09 DIAGNOSIS — F0281 Dementia in other diseases classified elsewhere with behavioral disturbance: Secondary | ICD-10-CM

## 2021-06-09 DIAGNOSIS — F900 Attention-deficit hyperactivity disorder, predominantly inattentive type: Secondary | ICD-10-CM

## 2021-06-09 MED ORDER — LISDEXAMFETAMINE DIMESYLATE 60 MG PO CAPS: 60.0000 mg | ORAL_CAPSULE | Freq: Every day | ORAL | 0 refills | Status: DC

## 2021-06-09 NOTE — Telephone Encounter (Signed)
Patient's mother called in for refill on Vyvanse 60mg . Appt 10/25. [facility] [address] [place]

## 2021-06-09 NOTE — Telephone Encounter (Signed)
Pended.

## 2021-07-18 ENCOUNTER — Ambulatory Visit (INDEPENDENT_AMBULATORY_CARE_PROVIDER_SITE_OTHER): Payer: PPO | Admitting: Psychiatry

## 2021-07-18 ENCOUNTER — Other Ambulatory Visit: Payer: Self-pay

## 2021-07-18 DIAGNOSIS — F0281 Dementia in other diseases classified elsewhere with behavioral disturbance: Secondary | ICD-10-CM | POA: Diagnosis not present

## 2021-07-18 DIAGNOSIS — F900 Attention-deficit hyperactivity disorder, predominantly inattentive type: Secondary | ICD-10-CM | POA: Diagnosis not present

## 2021-07-18 DIAGNOSIS — F66 Other sexual disorders: Secondary | ICD-10-CM

## 2021-07-18 DIAGNOSIS — IMO0001 Reserved for inherently not codable concepts without codable children: Secondary | ICD-10-CM

## 2021-07-18 NOTE — Progress Notes (Signed)
Psychotherapy Progress Note [facility] Marliss Czar, PhD LP  Patient ID: Jonathan Moody     MRN: [id number] Therapy format: Individual psychotherapy Date: 07/18/2021      Start: 4:30p     Stop: 5:20p     Time Spent: 50 min Location: In-person   Session narrative (presenting needs, interim history, self-report of stressors and symptoms, applications of prior therapy, status changes, and interventions made in session) Most of session individual, with brief questions to GM before and note home afterward (unavailable for questions and report after session).  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  Day consists of doing a job, talking about the day, and going home, is all he can articulate.  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  Brother has a car, can drive now, sometimes spends the night.  No complaints of life with GM and GF.    Re. Behavior concerns, says he no longer goes in Mama's room, but may open the door if she doesn't lock it.  No more going into her dresser drawers.  Walks past her room now, not tempted to go in, unless it's to get his own clothes and that's all.  Says when he did get her underwear, he thought it was his.    Says there's an app got on his phone, can't get rid of it, shows me.  Turns out to be [employer], a [employer] associated with a site.  Says it will work at home but not away from the house, indicating Internet restrictions not established on home network.  After obtaining his consent, spent the remainder of the session reviewing and deleting suspicious apps from his phone, something in the neighborhood of 80 of them, many having to do with live wallpaper, live chat, video chat, and various kinds of sexy picture finders.  All of them were unfamiliar to him and  seem to be things he compulsively downloaded when he was browsing before.  One of them seemed to call chat invitations from an online exhibitionist or prostitute, but hopefully these have been dismissed.  No guarantee his phone has been sanitized completely, and GM Deborah unavailable at end of session.  Drafted a feedback note to her, to be mailed by staff, noting actions taken and asking guidance on further purposes and interests in therapy.  Therapeutic modalities: Cognitive Behavioral Therapy, Solution-Oriented/Positive Psychology, and Psychologist, occupational  Mental Status/Observations:  Appearance:   Casual     Behavior:  distracted  Motor:  Normal and exc ltd use of hand   Speech/Language:   Relatively clear, articulation disorder  Affect:  Flat  Mood:  normal  Thought process:  concrete  Thought content:    WNL  Sensory/Perceptual disturbances:    WNL  Orientation:  grossly intact  Attention:  Fair    Concentration:  Fair  Memory:  Not assessed  Insight:    Poor  Judgment:   Fair  Impulse Control:  Poor   Risk Assessment: Danger to Self: No Self-injurious Behavior: No Danger to Others: No Physical Aggression / Violence: No Duty to Warn: No Access to Firearms a concern: No  Assessment of progress:  stabilized  Diagnosis:   ICD-10-CM   1. 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 Pornography addiction  F66     3. Attention deficit hyperactivity disorder (ADHD), inattentive type, severe  F90.0      Plan:  Obtain feedback from GM whether his phone is functioning cleanly Recommendation that family still take steps to establish parental control over phone service and home network, probably have a knowledgeable man or woman in his life review what is on his phone to catch any unnoticed vulnerabilities Other recommendations/advice as may be noted above Continue to utilize  previously learned skills ad lib Maintain medication as prescribed and work faithfully with relevant prescriber(s) if any changes are desired or seem indicated Call the clinic on-call service, 988/hotline, present to ER, or call 911 if any life-threatening psychiatric crisis Return for time at discretion. Already scheduled visit in this office 08/24/2021.  Robley Fries, PhD Marliss Czar, PhD LP Clinical Psychologist, [facility] [address], [address] [place], [place] [address] [phone]

## 2021-08-09 ENCOUNTER — Other Ambulatory Visit: Payer: Self-pay

## 2021-08-09 ENCOUNTER — Telehealth: Payer: Self-pay | Admitting: Psychiatry

## 2021-08-09 DIAGNOSIS — F900 Attention-deficit hyperactivity disorder, predominantly inattentive type: Secondary | ICD-10-CM

## 2021-08-09 NOTE — Telephone Encounter (Signed)
Pended.

## 2021-08-09 NOTE — Telephone Encounter (Signed)
Tejon's mother called in for refill on Vyvanse 60mg . States he is down to his last two pills. Would like refill to be sent today. Ph: [phone]. Appt 11/15. [facility] [address] [place], [place]

## 2021-08-10 MED ORDER — LISDEXAMFETAMINE DIMESYLATE 60 MG PO CAPS: 60.0000 mg | ORAL_CAPSULE | Freq: Every day | ORAL | 0 refills | Status: DC

## 2021-08-15 ENCOUNTER — Ambulatory Visit: Payer: PPO | Admitting: Psychiatry

## 2021-08-17 ENCOUNTER — Ambulatory Visit: Payer: PPO | Admitting: Psychiatry

## 2021-08-24 ENCOUNTER — Ambulatory Visit: Payer: PPO | Admitting: Psychiatry

## 2021-09-05 ENCOUNTER — Other Ambulatory Visit: Payer: Self-pay

## 2021-09-05 ENCOUNTER — Encounter: Payer: Self-pay | Admitting: Psychiatry

## 2021-09-05 ENCOUNTER — Ambulatory Visit (INDEPENDENT_AMBULATORY_CARE_PROVIDER_SITE_OTHER): Payer: PPO | Admitting: Psychiatry

## 2021-09-05 DIAGNOSIS — R419 Unspecified symptoms and signs involving cognitive functions and awareness: Secondary | ICD-10-CM

## 2021-09-05 DIAGNOSIS — F900 Attention-deficit hyperactivity disorder, predominantly inattentive type: Secondary | ICD-10-CM | POA: Diagnosis not present

## 2021-09-05 MED ORDER — LISDEXAMFETAMINE DIMESYLATE 60 MG PO CAPS: 60.0000 mg | ORAL_CAPSULE | Freq: Every day | ORAL | 0 refills | Status: DC

## 2021-09-05 MED ORDER — LAMOTRIGINE 100 MG PO TABS: 100.0000 mg | ORAL_TABLET | Freq: Every day | ORAL | 1 refills | Status: DC

## 2021-09-05 NOTE — Progress Notes (Signed)
Jonathan Moody [id number] August 31, 2001 20 y.o.  Subjective:   Patient ID:  Jonathan Moody is a 20 y.o. (DOB [birth year]) male.  Chief Complaint:  Chief Complaint  Patient presents with   Follow-up    ADHD and neurocognitive disorder     HPI Jonathan Moody presents to the office today for follow-up of ADHD and neurocognitive disorder. He is accompanied by his mother. Denies anxiety. He reports that he feels sad when he looks at his yearbook. Denies any other depression. Energy is good. He reports some challenges with concentration and is able to to focus with intention. Sleeping well and averages 7-9 hours a night. Appetite is good for foods he likes. He has certain food preferences. Denies SI.   Mother reports that he has difficulty with change and does better with being prepared in advance. He will graduate this year.   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 Has made new friends there.  He is enjoying this. He continues to enjoy watching [employer]. Practice basketball.   Past Psychiatric Medication Trials: Vyvanse Lamictal   Flowsheet Row ED from 08/10/2018 in [facility]  C-SSRS RISK CATEGORY High Risk        Review of Systems:  Review of Systems  Cardiovascular:  Negative for palpitations.  Musculoskeletal:  Negative for gait problem.  Neurological:  Negative for tremors.  Psychiatric/Behavioral:         Please refer to HPI   Medications: I have reviewed the patient's current medications.  Current Outpatient Medications  Medication Sig Dispense Refill   Docusate Sodium 100 MG capsule Take 100 mg by mouth 2 (two) times daily as needed for constipation.     lamoTRIgine (LAMICTAL) 100 MG tablet Take 1 tablet (100 mg total) by mouth daily after breakfast. 90 tablet 1   lamoTRIgine (LAMICTAL) 100 MG tablet Take 1 tablet (100 mg total) by mouth daily. 90 tablet 1   Multiple Vitamins-Minerals (MULTIVITAMIN ADULT PO)  Take 1 tablet by mouth daily.     polyethylene glycol (MIRALAX / GLYCOLAX) 17 g packet Take 17 g by mouth daily.     Probiotic Product (PROBIOTIC PO) Take by mouth.     lisdexamfetamine (VYVANSE) 60 MG capsule Take 1 capsule (60 mg total) by mouth daily after breakfast. 30 capsule 0   [START ON 11/02/2021] lisdexamfetamine (VYVANSE) 60 MG capsule Take 1 capsule (60 mg total) by mouth daily after breakfast. 30 capsule 0   [START ON 10/05/2021] lisdexamfetamine (VYVANSE) 60 MG capsule Take 1 capsule (60 mg total) by mouth daily after breakfast. 30 capsule 0   [START ON 09/07/2021] lisdexamfetamine (VYVANSE) 60 MG capsule Take 1 capsule (60 mg total) by mouth daily after breakfast. 30 capsule 0   No current facility-administered medications for this visit.    Medication Side Effects: None  Allergies: No Known Allergies  Past Medical History:  Diagnosis Date   ADHD (attention deficit hyperactivity disorder)    Left-sided weakness    Seizure (HCC)    TBI (traumatic brain injury)     Past Medical History, Surgical history, Social history, and Family history were reviewed and updated as appropriate.   Please see review of systems for further details on the patient's review from today.   Objective:   Physical Exam:  BP 137/85   Pulse 86   Wt 170 lb (77.1 kg)   BMI 24.05 kg/m   Physical Exam Constitutional:      General: He is not  in acute distress. Musculoskeletal:        General: No deformity.  Neurological:     Mental Status: He is alert and oriented to person, place, and time.     Coordination: Coordination normal.  Psychiatric:        Attention and Perception: Attention and perception normal. He does not perceive auditory or visual hallucinations.        Mood and Affect: Mood normal. Mood is not anxious or depressed. Affect is not labile, blunt, angry or inappropriate.        Behavior: Behavior is cooperative.        Thought Content: Thought content normal. Thought content  is not paranoid or delusional. Thought content does not include homicidal or suicidal ideation. Thought content does not include homicidal or suicidal plan.     Comments: Insight and judgment fair Concrete thought processes Somewhat withdrawn and inattentive at times. Engaged, more verbal, and affect brightens when topic shifts to one of his interests, ie. Basketball.    Lab Review:     Component Value Date/Time   NA 144 08/10/2018 1551   K 3.7 08/10/2018 1551   CL 107 08/10/2018 1551   CO2 29 08/10/2018 1551   GLUCOSE 90 08/10/2018 1551   BUN 15 08/10/2018 1551   CREATININE 1.11 08/10/2018 1551   CALCIUM 9.9 08/10/2018 1551   PROT 8.0 08/10/2018 1551   ALBUMIN 4.6 08/10/2018 1551   AST 24 08/10/2018 1551   ALT 18 08/10/2018 1551   ALKPHOS 119 08/10/2018 1551   BILITOT 0.8 08/10/2018 1551   GFRNONAA >60 08/10/2018 1551   GFRAA >60 08/10/2018 1551       Component Value Date/Time   WBC 5.3 08/10/2018 1551   RBC 5.63 08/10/2018 1551   HGB 13.3 08/10/2018 1551   HCT 44.6 08/10/2018 1551   PLT 208 08/10/2018 1551   MCV 79.2 (L) 08/10/2018 1551   MCH 23.6 (L) 08/10/2018 1551   MCHC 29.8 (L) 08/10/2018 1551   RDW 13.2 08/10/2018 1551    No results found for: POCLITH, LITHIUM   No results found for: PHENYTOIN, PHENOBARB, VALPROATE, CBMZ   .res Assessment: Plan:    Pt seen for 30 minutes and time spent discussing pt's transition into [employer] to help support his vocational growth and function. Discussed ways that he may communicate his needs and symptoms to employers/facilitators since mother reports that he has told others he has "anger issues" and this may not be the most accurate description for what he experiences and he has not had severe anger since starting Lamictal. Discussed that he may wish to consider using terms like "frustrated" or "overwhelmed" since these terms may better describe what he experiences without implying aggression, which is not an issue for  him.  Continue Lamictal 100 mg po qd for neurocognitive disorder.  Continue Vyvanse 60 mg po qd for ADHD.  Pt to follow-up in 6 months or sooner if clinically indicated.  Requested pt's mother call in 3 months to provide update and request additional scripts.  Patient advised to contact office with any questions, adverse effects, or acute worsening in signs and symptoms.   Jonathan Moody was seen today for follow-up.  Diagnoses and all orders for this visit:  Neurocognitive disorder -     lamoTRIgine (LAMICTAL) 100 MG tablet; Take 1 tablet (100 mg total) by mouth daily.  Attention deficit hyperactivity disorder (ADHD), inattentive type, severe -     lisdexamfetamine (VYVANSE) 60 MG capsule; Take 1 capsule (60  mg total) by mouth daily after breakfast. -     lisdexamfetamine (VYVANSE) 60 MG capsule; Take 1 capsule (60 mg total) by mouth daily after breakfast. -     lisdexamfetamine (VYVANSE) 60 MG capsule; Take 1 capsule (60 mg total) by mouth daily after breakfast.    Please see After Visit Summary for patient specific instructions.  Future Appointments  Date Time Provider Department Center  09/19/2021  4:00 PM Robley Fries, PhD CP-CP None  10/19/2021  4:00 PM Robley Fries, PhD CP-CP None  03/06/2022  4:00 PM Corie Chiquito, PMHNP CP-CP None    No orders of the defined types were placed in this encounter.   -------------------------------

## 2021-09-18 ENCOUNTER — Ambulatory Visit: Payer: PPO | Admitting: Psychiatry

## 2021-09-19 ENCOUNTER — Other Ambulatory Visit: Payer: Self-pay

## 2021-09-19 ENCOUNTER — Ambulatory Visit (INDEPENDENT_AMBULATORY_CARE_PROVIDER_SITE_OTHER): Payer: PPO | Admitting: Psychiatry

## 2021-09-19 DIAGNOSIS — R419 Unspecified symptoms and signs involving cognitive functions and awareness: Secondary | ICD-10-CM | POA: Diagnosis not present

## 2021-09-19 DIAGNOSIS — F66 Other sexual disorders: Secondary | ICD-10-CM | POA: Diagnosis not present

## 2021-09-19 DIAGNOSIS — F900 Attention-deficit hyperactivity disorder, predominantly inattentive type: Secondary | ICD-10-CM

## 2021-09-19 NOTE — Progress Notes (Signed)
Psychotherapy Progress Note [facility] Marliss Czar, PhD LP  Patient ID: Jonathan Moody)    MRN: [id number] Therapy format: Family therapy w/ patient -- accompanied by Jonathan Moody Date: 09/19/2021      Start: 4:24p     Stop: 4:44p     Time Spent: 20 min Location: In-person   Session narrative (presenting needs, interim history, self-report of stressors and symptoms, applications of prior therapy, status changes, and interventions made in session) PT no longer wants to come to therapy, and Jonathan Moody is satisfied originally concerns have been addressed.  Jonathan Moody feels he has achieved better boundaries and is more trustworthy with females and pornography now.  Jonathan Moody reports they did get his phone out to a the shop and it was thoroughly cleaned of junk apps and illicit material, including app-finders that were recruiting risky software.  Jonathan Moody agrees he will ask for help if he finds again that his phone is having any problems, from slow response to frustrating use, to material he feels he dd not ask for.  Continued pledge not to look at "dirty pictures".  Jonathan Moody has been working on how to ask people for clarification and help as a general life skill.  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 16-20 hrs/wk.  Anticipate living still with grandparents the next several years.  Is transitioning into the [employer] program through the county, which will be working on more independent living skills and providing ongoing support for his developmental needs.  Oriented to continuing service availability and conditions, agreed to go on as-needed basis.  Therapeutic modalities: Cognitive Behavioral Therapy, Solution-Oriented/Positive Psychology, and Psychologist, occupational  Mental Status/Observations:  Appearance:   Casual and Neat     Behavior:  preoccupied  Motor:  Mostly  WNL exc. baseline impaired use of hand  Speech/Language:   Clear, baseline poverty of content, developmentally nearer  elementary level  Affect:  Constricted  Mood:  normal  Thought process:  Simplistic, perseverative  Thought content:    WNL  Sensory/Perceptual disturbances:    grossly intact  Orientation:  grossly intact  Attention:  Fair    Concentration:  Fair  Memory:  Not assessed  Insight:    Fair  Judgment:   Fair  Impulse Control:  Fair   Risk Assessment: Danger to Self: No Self-injurious Behavior: No Danger to Others: No Physical Aggression / Violence: No Duty to Warn: No Access to Firearms a concern: No  Assessment of progress:  stabilized  Diagnosis:   ICD-10-CM   1. Neurocognitive disorder  R41.9     2. Attention deficit hyperactivity disorder (ADHD), inattentive type, severe  F90.0     3. 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      Plan:  Maintain device security with phone -- Jonathan Moody will ask for help, family members will address cleaning it if renewed concerns Maintain involvement with appropriate public services like [employer] and [employer] Other recommendations/advice as may be noted above Continue to utilize previously learned skills ad lib Maintain medication as prescribed and work faithfully with relevant prescriber(s) if any changes are desired or seem indicated Call the clinic on-call service, 988/hotline, 911, or present to [facility] or ER if any life-threatening psychiatric crisis Return as needed only, may cancel. Already scheduled visit in this office 10/19/2021.  Jonathan Fries, PhD Marliss Czar, PhD LP Clinical Psychologist, [facility] [address] [place], [place] [address] [phone]

## 2021-10-19 ENCOUNTER — Ambulatory Visit: Payer: PPO | Admitting: Psychiatry

## 2021-11-08 ENCOUNTER — Telehealth: Payer: Self-pay | Admitting: Psychiatry

## 2021-11-08 NOTE — Telephone Encounter (Signed)
Called mom and she said the insurance company had called back and said everything was approved.  I don't see a PA in Cover My Meds and there is no documentation in Epic.

## 2021-11-08 NOTE — Telephone Encounter (Signed)
Next visit is 03/06/22. Jonathan Moody's mom, Jonathan Moody called. She is listed on the [facility]. She states their insurance company is trying to determine if [name] could switch from Vyvanse 60 mg as there is no generic available. Her phone number is [phone].

## 2021-11-09 ENCOUNTER — Telehealth: Payer: Self-pay | Admitting: Psychiatry

## 2021-11-09 NOTE — Telephone Encounter (Signed)
I called pharmacy and there was a RF. They ran it and said everything went thru fine. I then asked them to go ahead and fill it. Mom said they normally send her a text when Rx are ready. She will follow up with pharmacy. She requests that 3 more RF be pended.

## 2021-11-09 NOTE — Telephone Encounter (Signed)
Jonathan Moody's mom called and said that [facility] Sertraline and he is totally out of it now. It was increased from 1-1/2 pills to 2 pills daily. Please send a new RX to:  [id number] [place], [place] - [address]  Phone:  [phone]  Fax:  [phone]

## 2021-12-08 ENCOUNTER — Other Ambulatory Visit: Payer: Self-pay

## 2021-12-08 ENCOUNTER — Telehealth: Payer: Self-pay | Admitting: Psychiatry

## 2021-12-08 DIAGNOSIS — F900 Attention-deficit hyperactivity disorder, predominantly inattentive type: Secondary | ICD-10-CM

## 2021-12-08 MED ORDER — LISDEXAMFETAMINE DIMESYLATE 60 MG PO CAPS: 60.0000 mg | ORAL_CAPSULE | Freq: Every day | ORAL | 0 refills | Status: DC

## 2021-12-08 NOTE — Telephone Encounter (Signed)
Next visit is 03/06/22. Debra Kruszka, mom called to request a refill on Vyvanse 60 mg called to:  [id number] [place], [place] - [address]  Phone:  [phone]  Fax:  [phone]

## 2021-12-08 NOTE — Telephone Encounter (Signed)
Is Shanda Bumps out today?

## 2021-12-08 NOTE — Telephone Encounter (Signed)
Pended.

## 2021-12-18 ENCOUNTER — Telehealth: Payer: Self-pay

## 2021-12-19 NOTE — Telephone Encounter (Signed)
Vyvanse 60mg

## 2022-01-08 ENCOUNTER — Telehealth: Payer: Self-pay | Admitting: Psychiatry

## 2022-01-08 ENCOUNTER — Other Ambulatory Visit: Payer: Self-pay

## 2022-01-08 DIAGNOSIS — F900 Attention-deficit hyperactivity disorder, predominantly inattentive type: Secondary | ICD-10-CM

## 2022-01-08 MED ORDER — LISDEXAMFETAMINE DIMESYLATE 60 MG PO CAPS: 60.0000 mg | ORAL_CAPSULE | Freq: Every day | ORAL | 0 refills | Status: DC

## 2022-01-08 NOTE — Telephone Encounter (Signed)
Mom, Neoma Laming, called back at 11:40am with questions about the Vyvanse with the lamictal.  She just wants a call back to discuss medication for Jonathan Moody.  [phone] ?

## 2022-01-08 NOTE — Telephone Encounter (Signed)
Spoke to mom ,pended  ?

## 2022-01-08 NOTE — Telephone Encounter (Signed)
Mom called at 11:29 am and said that her son needs a 3 month supply of vyvanse 60 mg [facility] [address]. Od[facility] only has one pill left. The pharmacy  does have it in stock. ?

## 2022-03-06 ENCOUNTER — Encounter: Payer: Self-pay | Admitting: Psychiatry

## 2022-03-06 ENCOUNTER — Ambulatory Visit (INDEPENDENT_AMBULATORY_CARE_PROVIDER_SITE_OTHER): Payer: PPO | Admitting: Psychiatry

## 2022-03-06 VITALS — Ht 71.0 in | Wt 170.0 lb

## 2022-03-06 DIAGNOSIS — F39 Unspecified mood [affective] disorder: Secondary | ICD-10-CM | POA: Diagnosis not present

## 2022-03-06 DIAGNOSIS — R419 Unspecified symptoms and signs involving cognitive functions and awareness: Secondary | ICD-10-CM

## 2022-03-06 DIAGNOSIS — F900 Attention-deficit hyperactivity disorder, predominantly inattentive type: Secondary | ICD-10-CM

## 2022-03-06 MED ORDER — LISDEXAMFETAMINE DIMESYLATE 60 MG PO CAPS: 60.0000 mg | ORAL_CAPSULE | Freq: Every day | ORAL | 0 refills | Status: DC

## 2022-03-06 MED ORDER — LAMOTRIGINE 100 MG PO TABS: 100.0000 mg | ORAL_TABLET | Freq: Every day | ORAL | 2 refills | Status: DC
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
## 2022-03-21 ENCOUNTER — Telehealth: Payer: Self-pay | Admitting: Psychiatry

## 2022-03-21 NOTE — Telephone Encounter (Signed)
Pt's mom Deb, called today at 5:04p.  She said she left a 2 min message on the voice mail on Tues and was expecting a call back today.  I do not see anything noted in Epic. She needs you to call her back about some documentation or feedback  needed for Samay for graduation.  (Sorry she was vague on exactly what she is needing other than to talk to you).

## 2022-03-22 NOTE — Telephone Encounter (Signed)
Returned call to pt's mother. She reports that [facility] is eligible for [employer] ([facility] services) after being on wait list since he was 21 years old. [name] may be contacting this office to complete documentation.   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 Mother is trying to schedule services and activities to keep him active. Mother may look into services at [facility].  Discussed that provider agrees and supports pt receiving these services and will complete any necessary forms or documentation.

## 2022-05-08 ENCOUNTER — Telehealth: Payer: Self-pay | Admitting: Psychiatry

## 2022-05-08 ENCOUNTER — Other Ambulatory Visit: Payer: Self-pay

## 2022-05-08 ENCOUNTER — Ambulatory Visit (INDEPENDENT_AMBULATORY_CARE_PROVIDER_SITE_OTHER): Payer: PPO | Admitting: Family Medicine

## 2022-05-08 ENCOUNTER — Encounter: Payer: Self-pay | Admitting: Family Medicine

## 2022-05-08 VITALS — BP 132/78 | HR 90 | Resp 16 | Ht 70.0 in | Wt 171.0 lb

## 2022-05-08 DIAGNOSIS — R718 Other abnormality of red blood cells: Secondary | ICD-10-CM | POA: Diagnosis not present

## 2022-05-08 DIAGNOSIS — R012 Other cardiac sounds: Secondary | ICD-10-CM | POA: Diagnosis not present

## 2022-05-08 DIAGNOSIS — F900 Attention-deficit hyperactivity disorder, predominantly inattentive type: Secondary | ICD-10-CM

## 2022-05-08 DIAGNOSIS — Z79899 Other long term (current) drug therapy: Secondary | ICD-10-CM

## 2022-05-08 DIAGNOSIS — F02818 Dementia in other diseases classified elsewhere, unspecified severity, with other behavioral disturbance: Secondary | ICD-10-CM | POA: Diagnosis not present

## 2022-05-08 DIAGNOSIS — Z131 Encounter for screening for diabetes mellitus: Secondary | ICD-10-CM

## 2022-05-08 DIAGNOSIS — R569 Unspecified convulsions: Secondary | ICD-10-CM | POA: Insufficient documentation

## 2022-05-08 DIAGNOSIS — S0990XS Unspecified injury of head, sequela: Secondary | ICD-10-CM

## 2022-05-08 DIAGNOSIS — S069XAS Unspecified intracranial injury with loss of consciousness status unknown, sequela: Secondary | ICD-10-CM | POA: Diagnosis not present

## 2022-05-08 DIAGNOSIS — H548 Legal blindness, as defined in USA: Secondary | ICD-10-CM | POA: Insufficient documentation

## 2022-05-08 DIAGNOSIS — G8190 Hemiplegia, unspecified affecting unspecified side: Secondary | ICD-10-CM | POA: Diagnosis not present

## 2022-05-08 DIAGNOSIS — Z13220 Encounter for screening for lipoid disorders: Secondary | ICD-10-CM | POA: Diagnosis not present

## 2022-05-08 MED ORDER — LISDEXAMFETAMINE DIMESYLATE 60 MG PO CAPS: 60.0000 mg | ORAL_CAPSULE | Freq: Every day | ORAL | 0 refills | Status: DC

## 2022-05-08 NOTE — Progress Notes (Addendum)
Name: Jonathan Moody   MRN: [id number]    DOB: 09-02-2001   Date:05/08/2022       Progress Note  Subjective  Chief Complaint  Chief Complaint  Patient presents with   Establish Care    HPI  New Patient  Jonathan Moody lives with adopted maternal grandparents since age 21. They are his legal guardians. Biological mother and father are still involved in his life.   He had a traumatic brain injury at age 62 months, prior to that he had normal development. Since the injury he has not developed normally. He also has left upper extremity hemiparesis and atrophy ( per grandmother hemorrhagic cause) , he had seizures ( not currently ) used to have episodes of agitation, but doing well with psychotherapy and psychiatric medications. He has difficulty showering by himself and is unable to have a job or do instrumental activities of daily living. He likes playing basketball and his adoptive mother would like him to resume OT and if needed PT for increase in function. 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) 12/30/2018    Past Surgical History:  Procedure Laterality Date   EYE SURGERY      Family History  Problem Relation Age of Onset   Epilepsy Maternal Grandfather     Social History   Tobacco Use   Smoking status: Never   Smokeless tobacco: Never  Substance Use Topics   Alcohol use: No     Current Outpatient Medications:    lamoTRIgine (LAMICTAL) 100 MG tablet, Take 1 tablet (100 mg total) by mouth daily., Disp: 90 tablet, Rfl: 2   lisdexamfetamine (VYVANSE) 60 MG capsule, Take 1 capsule (60 mg total) by mouth daily after breakfast., Disp: 30 capsule, Rfl: 0    lisdexamfetamine (VYVANSE) 60 MG capsule, Take 1 capsule (60 mg total) by mouth daily after breakfast., Disp: 30 capsule, Rfl: 0   lisdexamfetamine (VYVANSE) 60 MG capsule, Take 1 capsule (60 mg total) by mouth daily after breakfast., Disp: 30 capsule, Rfl: 0   Multiple Vitamins-Minerals (MULTIVITAMIN ADULT PO), Take 1 tablet by mouth daily., Disp: , Rfl:    polyethylene glycol (MIRALAX / GLYCOLAX) 17 g packet, Take 17 g by mouth daily., Disp: , Rfl:    Probiotic Product (PROBIOTIC PO), Take by mouth., Disp: , Rfl:    lisdexamfetamine (VYVANSE) 60 MG capsule, Take 1 capsule (60 mg total) by mouth daily after breakfast., Disp: 30 capsule, Rfl: 0   lisdexamfetamine (VYVANSE) 60 MG capsule, Take 1 capsule (60 mg total) by mouth daily after breakfast., Disp: 30 capsule, Rfl: 0  No Known Allergies  I personally reviewed active problem list, medication list, allergies, family history, social history, health maintenance with the patient/caregiver today.   ROS  Ten systems reviewed and is negative except as mentioned in HPI   Objective  Vitals:   05/08/22 1121  BP: 132/78  Pulse: 90  Resp: 16  SpO2: 99%  Weight: 171 lb (77.6 kg)  Height: 5\' 10"  (1.778 m)    Body mass index is 24.54 kg/m.  Physical Exam  Constitutional: Patient appears well-developed and well-nourished.  No distress.  HEENT: head atraumatic, normocephalic, pupils equal and reactive to  light, neck supple, throat within normal limits Cardiovascular: Normal rate, regular rhythm , split second heart sound . No murmur heard. No BLE edema. Pulmonary/Chest: Effort normal and breath sounds normal. No respiratory distress. Abdominal: Soft.  There is no tenderness. Muscular skeletal: unable to extend left wrist and elbow, decrease abduction left shoulder, atrophy Left upper extremity  Psychiatric: good eye contact , cooperative, mother answered all questions    PHQ2/9:    05/08/2022   11:20 AM  Depression screen PHQ 2/9   Decreased Interest 0  Down, Depressed, Hopeless 0  PHQ - 2 Score 0  Altered sleeping 0  Tired, decreased energy 0  Change in appetite 0  Feeling bad or failure about yourself  0  Trouble concentrating 0  Moving slowly or fidgety/restless 0  Suicidal thoughts 0  PHQ-9 Score 0    phq 9 is negative   Fall Risk:    05/08/2022   11:20 AM  Fall Risk   Falls in the past year? 0  Number falls in past yr: 0  Injury with Fall? 0  Risk for fall due to : No Fall Risks  Follow up Falls prevention discussed     Functional Status Survey: Is the patient deaf or have difficulty hearing?: No Does the patient have difficulty seeing, even when wearing glasses/contacts?: Yes Does the patient have difficulty concentrating, remembering, or making decisions?: No Does the patient have difficulty walking or climbing stairs?: No Does the patient have difficulty dressing or bathing?: No Does the patient have difficulty doing errands alone such as visiting a doctor's office or shopping?: Yes    Assessment & Plan  1. Seizure (HCC)  No episodes since infancy , grandfather also had seizures as a young adult   2. 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 Attention deficit hyperactivity disorder (ADHD), inattentive type, severe  Taking Vyvanse   4. 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 Long-term use of high-risk medication  - CBC with Differential/Platelet - COMPLETE METABOLIC PANEL WITH GFR  6. Diabetes mellitus screening  - Hemoglobin A1c  7. Lipid screening  - Cholesterol, Total   8. Split S2 (second heart sound)  We will obtain records from pediatrician, grandmother / adopted mother thinks he has seen a cardiologist in the past

## 2022-05-08 NOTE — Telephone Encounter (Signed)
Mother called in 11am to request we switch the V[facility]se RF [facility] a different [facility]. [facility] and recommended that [facility] [address] B/c they have it in stock. Please send today if possible as he will be out tomorrow. [facility] [address]

## 2022-05-08 NOTE — Telephone Encounter (Signed)
Pended.

## 2022-05-09 ENCOUNTER — Other Ambulatory Visit: Payer: Self-pay

## 2022-05-09 DIAGNOSIS — R718 Other abnormality of red blood cells: Secondary | ICD-10-CM

## 2022-05-10 ENCOUNTER — Ambulatory Visit: Payer: Self-pay | Admitting: *Deleted

## 2022-05-10 LAB — CBC WITH DIFFERENTIAL/PLATELET
Absolute Monocytes: 353 cells/uL (ref 200–950)
Basophils Absolute: 29 cells/uL (ref 0–200)
Basophils Relative: 0.7 %
Eosinophils Absolute: 122 cells/uL (ref 15–500)
Eosinophils Relative: 2.9 %
HCT: 45.8 % (ref 38.5–50.0)
Hemoglobin: 14 g/dL (ref 13.2–17.1)
Lymphs Abs: 1655 cells/uL (ref 850–3900)
MCH: 23.9 pg — ABNORMAL LOW (ref 27.0–33.0)
MCHC: 30.6 g/dL — ABNORMAL LOW (ref 32.0–36.0)
MCV: 78 fL — ABNORMAL LOW (ref 80.0–100.0)
MPV: 10.2 fL (ref 7.5–12.5)
Monocytes Relative: 8.4 %
Neutro Abs: 2041 cells/uL (ref 1500–7800)
Neutrophils Relative %: 48.6 %
Platelets: 246 10*3/uL (ref 140–400)
RBC: 5.87 10*6/uL — ABNORMAL HIGH (ref 4.20–5.80)
RDW: 14.1 % (ref 11.0–15.0)
Total Lymphocyte: 39.4 %
WBC: 4.2 10*3/uL (ref 3.8–10.8)

## 2022-05-10 LAB — TEST AUTHORIZATION

## 2022-05-10 LAB — COMPLETE METABOLIC PANEL WITH GFR
AG Ratio: 2.2 (calc) (ref 1.0–2.5)
ALT: 18 U/L (ref 9–46)
AST: 15 U/L (ref 10–40)
Albumin: 4.7 g/dL (ref 3.6–5.1)
Alkaline phosphatase (APISO): 88 U/L (ref 36–130)
BUN: 10 mg/dL (ref 7–25)
CO2: 32 mmol/L (ref 20–32)
Calcium: 9.6 mg/dL (ref 8.6–10.3)
Chloride: 104 mmol/L (ref 98–110)
Creat: 1.16 mg/dL (ref 0.60–1.24)
Globulin: 2.1 g/dL (calc) (ref 1.9–3.7)
Glucose, Bld: 89 mg/dL (ref 65–99)
Potassium: 4.3 mmol/L (ref 3.5–5.3)
Sodium: 142 mmol/L (ref 135–146)
Total Bilirubin: 0.5 mg/dL (ref 0.2–1.2)
Total Protein: 6.8 g/dL (ref 6.1–8.1)
eGFR: 92 mL/min/{1.73_m2} (ref >=60)

## 2022-05-10 LAB — IRON,TIBC AND FERRITIN PANEL
%SAT: 27 % (calc) (ref 20–48)
Ferritin: 15 ng/mL — ABNORMAL LOW (ref 38–380)
Iron: 86 ug/dL (ref 50–195)
TIBC: 318 mcg/dL (calc) (ref 250–425)

## 2022-05-10 LAB — HEMOGLOBIN A1C
Hgb A1c MFr Bld: 4.5 % of total Hgb (ref <5.7)
Mean Plasma Glucose: 82 mg/dL
eAG (mmol/L): 4.6 mmol/L

## 2022-05-10 LAB — CHOLESTEROL, TOTAL: Cholesterol: 132 mg/dL (ref <200)

## 2022-05-10 NOTE — Telephone Encounter (Signed)
Mother returned call.  Given lab result message from Dr. Carlynn Purl dated 05/09/2022 at 1:07 PM.   Reason for Disposition  [1] Follow-up call to recent contact AND [2] information only call, no triage required  Answer Assessment - Initial Assessment Questions 1. REASON FOR CALL or QUESTION: "What is your reason for calling today?" or "How can I best help you?" or "What question do you have that I can help answer?"     Lab results given to mother  Protocols used: Information Only Call - No Triage-A-AH

## 2022-05-21 ENCOUNTER — Telehealth: Payer: Self-pay | Admitting: Psychiatry

## 2022-05-21 NOTE — Telephone Encounter (Signed)
Pt's mom Jonathan Moody called reporting [facility] has faxed or mailed paper for JC to sign and fax back. Needed ASAP. [name] would be who sent info. Contact mom with questions if needed [phone]

## 2022-05-21 NOTE — Telephone Encounter (Signed)
[name] faxed forms from [facility] for [name] to review, sign and fax back ASAP. Placed in [employer]

## 2022-05-23 ENCOUNTER — Telehealth: Payer: Self-pay | Admitting: Psychiatry

## 2022-05-23 NOTE — Telephone Encounter (Signed)
Faxed paperwork to [employer]. Also called and left message to advise if she does not receive.

## 2022-05-23 NOTE — Telephone Encounter (Signed)
Jonathan Moody confirmed she received fax today.

## 2022-05-25 DIAGNOSIS — H548 Legal blindness, as defined in USA: Secondary | ICD-10-CM | POA: Diagnosis not present

## 2022-05-25 DIAGNOSIS — H5213 Myopia, bilateral: Secondary | ICD-10-CM | POA: Diagnosis not present

## 2022-05-25 DIAGNOSIS — R29818 Other symptoms and signs involving the nervous system: Secondary | ICD-10-CM | POA: Diagnosis not present

## 2022-05-25 DIAGNOSIS — S069XAA Unspecified intracranial injury with loss of consciousness status unknown, initial encounter: Secondary | ICD-10-CM | POA: Diagnosis not present

## 2022-05-25 DIAGNOSIS — Z9889 Other specified postprocedural states: Secondary | ICD-10-CM | POA: Diagnosis not present

## 2022-05-25 DIAGNOSIS — R6250 Unspecified lack of expected normal physiological development in childhood: Secondary | ICD-10-CM | POA: Diagnosis not present

## 2022-05-25 DIAGNOSIS — H50112 Monocular exotropia, left eye: Secondary | ICD-10-CM | POA: Diagnosis not present

## 2022-05-25 DIAGNOSIS — H442E3 Degenerative myopia with other maculopathy, bilateral eye: Secondary | ICD-10-CM | POA: Diagnosis not present

## 2022-05-25 DIAGNOSIS — X58XXXA Exposure to other specified factors, initial encounter: Secondary | ICD-10-CM | POA: Diagnosis not present

## 2022-06-05 ENCOUNTER — Other Ambulatory Visit: Payer: Self-pay

## 2022-06-05 ENCOUNTER — Telehealth: Payer: Self-pay | Admitting: Psychiatry

## 2022-06-05 DIAGNOSIS — F900 Attention-deficit hyperactivity disorder, predominantly inattentive type: Secondary | ICD-10-CM

## 2022-06-05 MED ORDER — LISDEXAMFETAMINE DIMESYLATE 60 MG PO CAPS: 60.0000 mg | ORAL_CAPSULE | Freq: Every day | ORAL | 0 refills | Status: DC

## 2022-06-05 NOTE — Telephone Encounter (Signed)
Pt's [name] came in office reporting ALL [facility] are out. Requesting to send to [facility] [employer] in stock. Mom asking to pick up due to will be out of town 8/17 to [place] until 8/21.

## 2022-06-05 NOTE — Telephone Encounter (Signed)
Pended.

## 2022-06-22 ENCOUNTER — Telehealth: Payer: Self-pay

## 2022-06-22 NOTE — Telephone Encounter (Signed)
Copied from CRM [id number]. Topic: General - Other >> Jun 21, 2022 12:52 PM Everette C wrote: Reason for CRM: The patient's mother has called to follow up on the status of documents submitted last week from [employer] related to the patient's over the counter medications and the ability to take them   The document was requiring the signature of the patient's PCP   Please contact the patient's mother further when possible     06/22/22: I have submitted and faxed this paperwork twice with receipt confirmation. Patient's mother aware.

## 2022-07-04 ENCOUNTER — Telehealth: Payer: Self-pay | Admitting: Psychiatry

## 2022-07-04 ENCOUNTER — Other Ambulatory Visit: Payer: Self-pay

## 2022-07-04 NOTE — Telephone Encounter (Signed)
Pended.

## 2022-07-04 NOTE — Telephone Encounter (Signed)
Mom called reporting [facility] [address] has 40 mg and 20 mg Vyvanse. Requesting to send Rx for each to equal 60 mg daily. Mom taking care of this before she leaves to go out of town. Please send ASAP before they run out. # [phone] Gavin Pound

## 2022-07-05 MED ORDER — LISDEXAMFETAMINE DIMESYLATE 40 MG PO CAPS: 40.0000 mg | ORAL_CAPSULE | ORAL | 0 refills | Status: DC

## 2022-07-05 MED ORDER — LISDEXAMFETAMINE DIMESYLATE 20 MG PO CAPS: 20.0000 mg | ORAL_CAPSULE | Freq: Every day | ORAL | 0 refills | Status: DC

## 2022-07-05 NOTE — Telephone Encounter (Signed)
Notified. 

## 2022-08-10 DIAGNOSIS — Z9889 Other specified postprocedural states: Secondary | ICD-10-CM | POA: Diagnosis not present

## 2022-08-10 DIAGNOSIS — H548 Legal blindness, as defined in USA: Secondary | ICD-10-CM | POA: Diagnosis not present

## 2022-08-10 DIAGNOSIS — H50112 Monocular exotropia, left eye: Secondary | ICD-10-CM | POA: Diagnosis not present

## 2022-08-10 DIAGNOSIS — H5789 Other specified disorders of eye and adnexa: Secondary | ICD-10-CM | POA: Diagnosis not present

## 2022-08-29 ENCOUNTER — Other Ambulatory Visit: Payer: Self-pay

## 2022-08-29 DIAGNOSIS — F900 Attention-deficit hyperactivity disorder, predominantly inattentive type: Secondary | ICD-10-CM

## 2022-08-30 MED ORDER — LISDEXAMFETAMINE DIMESYLATE 60 MG PO CAPS: 60.0000 mg | ORAL_CAPSULE | Freq: Every day | ORAL | 0 refills | Status: DC

## 2022-08-30 NOTE — Telephone Encounter (Signed)
Please schedule appt

## 2022-08-30 NOTE — Telephone Encounter (Signed)
Pt is scheduled 11/28 

## 2022-08-30 NOTE — Telephone Encounter (Signed)
Script sent. Please ask mother to schedule follow-up apt.

## 2022-09-10 NOTE — Patient Instructions (Signed)
Preventive Care 21-22 Years Old, Male Preventive care refers to lifestyle choices and visits with your health care provider that can promote health and wellness. Preventive care visits are also called wellness exams. What can I expect for my preventive care visit? Counseling During your preventive care visit, your health care provider may ask about your: Medical history, including: Past medical problems. Family medical history. Current health, including: Emotional well-being. Home life and relationship well-being. Sexual activity. Lifestyle, including: Alcohol, nicotine or tobacco, and drug use. Access to firearms. Diet, exercise, and sleep habits. Safety issues such as seatbelt and bike helmet use. Sunscreen use. Work and work environment. Physical exam Your health care provider may check your: Height and weight. These may be used to calculate your BMI (body mass index). BMI is a measurement that tells if you are at a healthy weight. Waist circumference. This measures the distance around your waistline. This measurement also tells if you are at a healthy weight and may help predict your risk of certain diseases, such as type 2 diabetes and high blood pressure. Heart rate and blood pressure. Body temperature. Skin for abnormal spots. What immunizations do I need?  Vaccines are usually given at various ages, according to a schedule. Your health care provider will recommend vaccines for you based on your age, medical history, and lifestyle or other factors, such as travel or where you work. What tests do I need? Screening Your health care provider may recommend screening tests for certain conditions. This may include: Lipid and cholesterol levels. Diabetes screening. This is done by checking your blood sugar (glucose) after you have not eaten for a while (fasting). Hepatitis B test. Hepatitis C test. HIV (human immunodeficiency virus) test. STI (sexually transmitted infection)  testing, if you are at risk. Talk with your health care provider about your test results, treatment options, and if necessary, the need for more tests. Follow these instructions at home: Eating and drinking  Eat a healthy diet that includes fresh fruits and vegetables, whole grains, lean protein, and low-fat dairy products. Drink enough fluid to keep your urine pale yellow. Take vitamin and mineral supplements as recommended by your health care provider. Do not drink alcohol if your health care provider tells you not to drink. If you drink alcohol: Limit how much you have to 0-2 drinks a day. Know how much alcohol is in your drink. In the [place] one drink equals one 12 oz bottle of beer (355 mL), one 5 oz glass of wine (148 mL), or one 1 oz glass of hard liquor (44 mL). Lifestyle Brush your teeth every morning and night with fluoride toothpaste. Floss one time each day. Exercise for at least 30 minutes 5 or more days each week. Do not use any products that contain nicotine or tobacco. These products include cigarettes, chewing tobacco, and vaping devices, such as e-cigarettes. If you need help quitting, ask your health care provider. Do not use drugs. If you are sexually active, practice safe sex. Use a condom or other form of protection to prevent STIs. Find healthy ways to manage stress, such as: Meditation, yoga, or listening to music. Journaling. Talking to a trusted person. Spending time with friends and family. Minimize exposure to UV radiation to reduce your risk of skin cancer. Safety Always wear your seat belt while driving or riding in a vehicle. Do not drive: If you have been drinking alcohol. Do not ride with someone who has been drinking. If you have been using any mind-altering substances   or drugs. While texting. When you are tired or distracted. Wear a helmet and other protective equipment during sports activities. If you have firearms in your house, make sure you  follow all gun safety procedures. Seek help if you have been physically or sexually abused. What's next? Go to your health care provider once a year for an annual wellness visit. Ask your health care provider how often you should have your eyes and teeth checked. Stay up to date on all vaccines. This information is not intended to replace advice given to you by your health care provider. Make sure you discuss any questions you have with your health care provider. Document Revised: 04/05/2021 Document Reviewed: 04/05/2021 Elsevier Patient Education  2023 Elsevier Inc.  
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
## 2022-09-11 ENCOUNTER — Encounter: Payer: Self-pay | Admitting: Family Medicine

## 2022-09-11 ENCOUNTER — Ambulatory Visit (INDEPENDENT_AMBULATORY_CARE_PROVIDER_SITE_OTHER): Payer: PPO | Admitting: Family Medicine

## 2022-09-11 VITALS — BP 124/72 | HR 93 | Resp 16 | Ht 70.0 in | Wt 173.0 lb

## 2022-09-11 DIAGNOSIS — Z0000 Encounter for general adult medical examination without abnormal findings: Secondary | ICD-10-CM | POA: Diagnosis not present

## 2022-09-11 DIAGNOSIS — Z23 Encounter for immunization: Secondary | ICD-10-CM | POA: Diagnosis not present

## 2022-09-11 DIAGNOSIS — Z113 Encounter for screening for infections with a predominantly sexual mode of transmission: Secondary | ICD-10-CM

## 2022-09-11 DIAGNOSIS — Z1159 Encounter for screening for other viral diseases: Secondary | ICD-10-CM

## 2022-09-11 DIAGNOSIS — E611 Iron deficiency: Secondary | ICD-10-CM | POA: Diagnosis not present

## 2022-09-11 MED ORDER — TETANUS-DIPHTH-ACELL PERTUSSIS 5-2-15.5 LF-MCG/0.5 IM SUSP: 0.5000 mL | Freq: Once | INTRAMUSCULAR | 0 refills | Status: AC

## 2022-09-12 LAB — IRON,TIBC AND FERRITIN PANEL
%SAT: 32 % (calc) (ref 20–48)
Ferritin: 28 ng/mL — ABNORMAL LOW (ref 38–380)
Iron: 98 ug/dL (ref 50–195)
TIBC: 308 mcg/dL (calc) (ref 250–425)

## 2022-09-12 LAB — HEPATITIS C ANTIBODY: Hepatitis C Ab: NONREACTIVE

## 2022-09-19 ENCOUNTER — Encounter: Payer: Self-pay | Admitting: Psychiatry

## 2022-09-19 ENCOUNTER — Ambulatory Visit (INDEPENDENT_AMBULATORY_CARE_PROVIDER_SITE_OTHER): Payer: PPO | Admitting: Psychiatry

## 2022-09-19 DIAGNOSIS — F900 Attention-deficit hyperactivity disorder, predominantly inattentive type: Secondary | ICD-10-CM | POA: Diagnosis not present

## 2022-09-19 DIAGNOSIS — R419 Unspecified symptoms and signs involving cognitive functions and awareness: Secondary | ICD-10-CM

## 2022-09-19 MED ORDER — LISDEXAMFETAMINE DIMESYLATE 60 MG PO CAPS: 60.0000 mg | ORAL_CAPSULE | Freq: Every day | ORAL | 0 refills | Status: DC

## 2022-09-19 MED ORDER — LAMOTRIGINE 100 MG PO TABS: 100.0000 mg | ORAL_TABLET | Freq: Every day | ORAL | 3 refills | Status: DC
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
## 2022-10-04 ENCOUNTER — Other Ambulatory Visit: Payer: Self-pay

## 2022-10-04 ENCOUNTER — Telehealth: Payer: Self-pay | Admitting: Psychiatry

## 2022-10-04 DIAGNOSIS — F900 Attention-deficit hyperactivity disorder, predominantly inattentive type: Secondary | ICD-10-CM

## 2022-10-04 MED ORDER — LISDEXAMFETAMINE DIMESYLATE 60 MG PO CAPS: 60.0000 mg | ORAL_CAPSULE | Freq: Every day | ORAL | 0 refills | Status: DC

## 2022-10-04 NOTE — Telephone Encounter (Signed)
Pended.

## 2022-10-04 NOTE — Telephone Encounter (Signed)
[name] called at 4:05 today requesting a refill on the Vyvanse. Patient only has one remaining. She was told to inform the office if it was out of stock. Please send to the [facility] on [address]. It is currently in stock as of today.  Next appointment scheduled for 03/12/23 Mom requesting a call when refill submitted # [phone]

## 2022-11-06 NOTE — Progress Notes (Deleted)
Name: Jonathan Moody   MRN: [id number]    DOB: 14-Feb-2001   Date:11/06/2022       Progress Note  Subjective  Chief Complaint  Follow Up  HPI  *** Patient Active Problem List   Diagnosis Date Noted   Legal blindness of left eye, as defined in [place] 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 ([place]) 12/30/2018   Strabismic amblyopia of left eye 06/10/2018   RBBB (right bundle branch block) 11/28/2017   History of strabismus surgery 06/05/2017   Myopic astigmatism, bilateral 06/05/2017   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   Development delay 05/29/2012    Past Surgical History:  Procedure Laterality Date   STRABISMUS SURGERY Left    at [facility]    Family History  Problem Relation Age of Onset   Hypertension Maternal Grandmother    Osteoarthritis Maternal Grandfather    Hypertension Maternal Grandfather    Epilepsy Maternal Grandfather     Social History   Tobacco Use   Smoking status: Never   Smokeless tobacco: Never  Substance Use Topics   Alcohol use: No     Current Outpatient Medications:    DOCUSATE SODIUM PO, Take by mouth daily., Disp: , Rfl:    ferrous sulfate 324 MG TBEC, Take 324 mg by mouth., Disp: , Rfl:    lamoTRIgine (LAMICTAL) 100 MG tablet, Take 1 tablet (100 mg total) by mouth daily., Disp: 90 tablet, Rfl: 3   lisdexamfetamine (VYVANSE) 60 MG capsule, Take 1 capsule (60 mg total) by mouth daily after breakfast., Disp: 30 capsule, Rfl: 0   lisdexamfetamine (VYVANSE) 60 MG capsule, Take 1 capsule (60 mg total) by mouth daily after breakfast., Disp: 30 capsule, Rfl: 0   [START ON 11/24/2022] lisdexamfetamine (VYVANSE) 60 MG capsule, Take 1 capsule (60 mg total) by mouth daily  after breakfast., Disp: 30 capsule, Rfl: 0   lisdexamfetamine (VYVANSE) 60 MG capsule, Take 1 capsule (60 mg total) by mouth daily after breakfast., Disp: 30 capsule, Rfl: 0   Multiple Vitamins-Minerals (MULTIVITAMIN ADULT PO), Take 1 tablet by mouth daily., Disp: , Rfl:    polyethylene glycol (MIRALAX / GLYCOLAX) 17 g packet, Take 17 g by mouth daily as needed for mild constipation., Disp: , Rfl:    Probiotic Product (PROBIOTIC PO), Take by mouth., Disp: , Rfl:   No Known Allergies  I personally reviewed active problem list, medication list, allergies, family history, social history, health maintenance with the patient/caregiver today.   ROS  ***  Objective  There were no vitals filed for this visit.  There is no height or weight on file to calculate BMI.  Physical Exam ***  Recent Results (from the past 2160 hour(s))  Hepatitis C antibody     Status: None   Collection Time: 09/11/22 10:35 AM  Result Value Ref Range   Hepatitis C Ab NON-REACTIVE NON-REACTIVE    Comment: . HCV antibody was non-reactive. There is no laboratory  evidence of HCV infection. . In most cases, no further action is required. However, if recent HCV exposure is suspected, a test for HCV RNA (test code 610 [id number] 4331) is suggested. . For additional information please refer to http://education.quest[facility].com/faq/FAQ22v1 (This link is being provided for informational/ educational purposes only.) .   Iron, TIBC  and Ferritin Panel     Status: Abnormal   Collection Time: 09/11/22 10:35 AM  Result Value Ref Range   Iron 98 50 - 195 mcg/dL   TIBC 308 250 - 425 mcg/dL (calc)   %SAT 32 20 - 48 % (calc)   Ferritin 28 (L) 38 - 380 ng/mL    PHQ2/9:    09/11/2022   10:00 AM 05/08/2022   11:20 AM  Depression screen PHQ 2/9  Decreased Interest 0 0  Down, Depressed, Hopeless 0 0  PHQ - 2 Score 0 0  Altered sleeping 0 0  Tired, decreased energy 0 0  Change in appetite 0 0  Feeling bad or failure about  yourself  0 0  Trouble concentrating 0 0  Moving slowly or fidgety/restless 0 0  Suicidal thoughts 0 0  PHQ-9 Score 0 0    phq 9 is {gen pos [id number]   Fall Risk:    09/11/2022   10:00 AM 05/08/2022   11:20 AM  Fall Risk   Falls in the past year? 0 0  Number falls in past yr: 0 0  Injury with Fall? 0 0  Risk for fall due to : No Fall Risks No Fall Risks  Follow up Falls prevention discussed Falls prevention discussed      Functional Status Survey:      Assessment & Plan  *** There are no diagnoses linked to this encounter.

## 2022-11-07 ENCOUNTER — Ambulatory Visit: Payer: PPO | Admitting: Family Medicine

## 2022-12-04 DIAGNOSIS — S069XAA Unspecified intracranial injury with loss of consciousness status unknown, initial encounter: Secondary | ICD-10-CM | POA: Diagnosis not present

## 2022-12-04 DIAGNOSIS — X58XXXA Exposure to other specified factors, initial encounter: Secondary | ICD-10-CM | POA: Diagnosis not present

## 2022-12-04 DIAGNOSIS — R29818 Other symptoms and signs involving the nervous system: Secondary | ICD-10-CM | POA: Diagnosis not present

## 2022-12-04 DIAGNOSIS — H53032 Strabismic amblyopia, left eye: Secondary | ICD-10-CM | POA: Diagnosis not present

## 2022-12-04 DIAGNOSIS — H548 Legal blindness, as defined in USA: Secondary | ICD-10-CM | POA: Diagnosis not present

## 2022-12-04 DIAGNOSIS — H442E3 Degenerative myopia with other maculopathy, bilateral eye: Secondary | ICD-10-CM | POA: Diagnosis not present

## 2022-12-04 DIAGNOSIS — H53022 Refractive amblyopia, left eye: Secondary | ICD-10-CM | POA: Diagnosis not present

## 2023-01-31 ENCOUNTER — Telehealth: Payer: Self-pay | Admitting: Psychiatry

## 2023-01-31 ENCOUNTER — Other Ambulatory Visit: Payer: Self-pay | Admitting: Psychiatry

## 2023-01-31 DIAGNOSIS — F900 Attention-deficit hyperactivity disorder, predominantly inattentive type: Secondary | ICD-10-CM

## 2023-01-31 MED ORDER — LISDEXAMFETAMINE DIMESYLATE 60 MG PO CAPS: 60.0000 mg | ORAL_CAPSULE | Freq: Every day | ORAL | 0 refills | Status: DC

## 2023-01-31 NOTE — Telephone Encounter (Signed)
Pt will need a RF on his Vyvanse 60mg  . Please send to [facility] [address], in stock there.

## 2023-03-01 ENCOUNTER — Telehealth: Payer: Self-pay | Admitting: Psychiatry

## 2023-03-01 DIAGNOSIS — F900 Attention-deficit hyperactivity disorder, predominantly inattentive type: Secondary | ICD-10-CM

## 2023-03-01 MED ORDER — LISDEXAMFETAMINE DIMESYLATE 60 MG PO CAPS: 60.0000 mg | ORAL_CAPSULE | Freq: Every day | ORAL | 0 refills | Status: DC

## 2023-03-01 NOTE — Telephone Encounter (Signed)
Patient's mom called in for refill on Vyvanse 60mg . Ph; [phone] Appt 5/21 Pharmacy [facility] [address] [place]

## 2023-03-01 NOTE — Telephone Encounter (Signed)
Please let his mom know it has been sent and can be filled on 03/04/23.

## 2023-03-12 ENCOUNTER — Ambulatory Visit (INDEPENDENT_AMBULATORY_CARE_PROVIDER_SITE_OTHER): Payer: Medicare HMO | Admitting: Psychiatry

## 2023-03-12 ENCOUNTER — Encounter: Payer: Self-pay | Admitting: Psychiatry

## 2023-03-12 DIAGNOSIS — R419 Unspecified symptoms and signs involving cognitive functions and awareness: Secondary | ICD-10-CM

## 2023-03-12 DIAGNOSIS — F900 Attention-deficit hyperactivity disorder, predominantly inattentive type: Secondary | ICD-10-CM | POA: Diagnosis not present

## 2023-03-12 MED ORDER — LAMOTRIGINE 100 MG PO TABS: 100.0000 mg | ORAL_TABLET | Freq: Every day | ORAL | 3 refills | Status: DC

## 2023-03-12 MED ORDER — LISDEXAMFETAMINE DIMESYLATE 60 MG PO CAPS: 60.0000 mg | ORAL_CAPSULE | Freq: Every day | ORAL | 0 refills | Status: DC
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
## 2023-03-14 ENCOUNTER — Ambulatory Visit: Payer: Medicare HMO
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
## 2023-05-21 ENCOUNTER — Ambulatory Visit: Payer: Medicare HMO | Admitting: Family Medicine

## 2023-05-21 ENCOUNTER — Encounter: Payer: Self-pay | Admitting: Family Medicine

## 2023-05-21 ENCOUNTER — Ambulatory Visit (INDEPENDENT_AMBULATORY_CARE_PROVIDER_SITE_OTHER): Payer: Medicare HMO | Admitting: Family Medicine

## 2023-05-21 VITALS — BP 126/70 | HR 91 | Resp 16 | Ht 71.0 in | Wt 185.0 lb

## 2023-05-21 DIAGNOSIS — Z0000 Encounter for general adult medical examination without abnormal findings: Secondary | ICD-10-CM | POA: Diagnosis not present

## 2023-05-21 DIAGNOSIS — Z0110 Encounter for examination of ears and hearing without abnormal findings: Secondary | ICD-10-CM

## 2023-05-21 DIAGNOSIS — Z0100 Encounter for examination of eyes and vision without abnormal findings: Secondary | ICD-10-CM | POA: Diagnosis not present

## 2023-07-02 ENCOUNTER — Other Ambulatory Visit: Payer: Self-pay

## 2023-07-02 ENCOUNTER — Telehealth: Payer: Self-pay | Admitting: Psychiatry

## 2023-07-02 DIAGNOSIS — F900 Attention-deficit hyperactivity disorder, predominantly inattentive type: Secondary | ICD-10-CM

## 2023-07-02 MED ORDER — LISDEXAMFETAMINE DIMESYLATE 60 MG PO CAPS
60.0000 mg | ORAL_CAPSULE | Freq: Every day | ORAL | 0 refills | Status: DC
Start: 2023-08-27 — End: 2023-09-10

## 2023-07-02 MED ORDER — LISDEXAMFETAMINE DIMESYLATE 60 MG PO CAPS: 60.0000 mg | ORAL_CAPSULE | Freq: Every day | ORAL | 0 refills | Status: DC

## 2023-07-02 MED ORDER — LISDEXAMFETAMINE DIMESYLATE 60 MG PO CAPS
60.0000 mg | ORAL_CAPSULE | Freq: Every day | ORAL | 0 refills | Status: DC
Start: 2023-07-02 — End: 2023-09-10

## 2023-07-02 NOTE — Telephone Encounter (Signed)
Jonathan Moody's mother called to request that we send [facility]fs[facility]. [id number] - [place], [place] - [address]

## 2023-07-02 NOTE — Telephone Encounter (Signed)
Pended.

## 2023-09-04 ENCOUNTER — Encounter: Payer: Self-pay | Admitting: Psychiatry

## 2023-09-10 ENCOUNTER — Ambulatory Visit (INDEPENDENT_AMBULATORY_CARE_PROVIDER_SITE_OTHER): Payer: Medicare HMO | Admitting: Psychiatry

## 2023-09-10 ENCOUNTER — Encounter: Payer: Self-pay | Admitting: Psychiatry

## 2023-09-10 DIAGNOSIS — F900 Attention-deficit hyperactivity disorder, predominantly inattentive type: Secondary | ICD-10-CM | POA: Diagnosis not present

## 2023-09-10 DIAGNOSIS — R419 Unspecified symptoms and signs involving cognitive functions and awareness: Secondary | ICD-10-CM

## 2023-09-10 MED ORDER — VYVANSE 60 MG PO CAPS
60.0000 mg | ORAL_CAPSULE | ORAL | 0 refills | Status: DC
Start: 2023-10-24 — End: 2024-03-09

## 2023-09-10 MED ORDER — VYVANSE 60 MG PO CAPS
60.0000 mg | ORAL_CAPSULE | ORAL | 0 refills | Status: DC
Start: 2023-09-26 — End: 2024-03-09

## 2023-09-10 MED ORDER — VYVANSE 60 MG PO CAPS: 60.0000 mg | ORAL_CAPSULE | ORAL | 0 refills | Status: DC

## 2023-09-10 MED ORDER — LAMOTRIGINE 100 MG PO TABS
100.0000 mg | ORAL_TABLET | Freq: Every day | ORAL | 3 refills | Status: DC
Start: 2023-09-10 — End: 2024-09-07
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
## 2023-09-12 ENCOUNTER — Telehealth: Payer: Self-pay | Admitting: Psychiatry

## 2023-09-12 NOTE — Telephone Encounter (Signed)
Form for [employer] services/Physicians order was dropped off and given to Champ.
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
## 2023-10-01 ENCOUNTER — Ambulatory Visit (INDEPENDENT_AMBULATORY_CARE_PROVIDER_SITE_OTHER): Payer: Medicare HMO | Admitting: Family Medicine

## 2023-10-01 ENCOUNTER — Encounter: Payer: Self-pay | Admitting: Family Medicine

## 2023-10-01 VITALS — BP 132/76 | HR 105 | Temp 97.7°F | Resp 16 | Ht 69.5 in | Wt 182.7 lb

## 2023-10-01 DIAGNOSIS — Z0001 Encounter for general adult medical examination with abnormal findings: Secondary | ICD-10-CM | POA: Diagnosis not present

## 2023-10-01 DIAGNOSIS — Z23 Encounter for immunization: Secondary | ICD-10-CM | POA: Diagnosis not present

## 2023-10-01 DIAGNOSIS — Z0000 Encounter for general adult medical examination without abnormal findings: Secondary | ICD-10-CM

## 2023-10-01 DIAGNOSIS — Z113 Encounter for screening for infections with a predominantly sexual mode of transmission: Secondary | ICD-10-CM

## 2023-10-01 DIAGNOSIS — Z79899 Other long term (current) drug therapy: Secondary | ICD-10-CM | POA: Diagnosis not present

## 2023-10-01 DIAGNOSIS — G8190 Hemiplegia, unspecified affecting unspecified side: Secondary | ICD-10-CM

## 2023-10-01 DIAGNOSIS — S0990XS Unspecified injury of head, sequela: Secondary | ICD-10-CM | POA: Diagnosis not present

## 2023-10-01 DIAGNOSIS — E611 Iron deficiency: Secondary | ICD-10-CM

## 2023-10-01 DIAGNOSIS — Z114 Encounter for screening for human immunodeficiency virus [HIV]: Secondary | ICD-10-CM

## 2023-10-02 LAB — COMPLETE METABOLIC PANEL WITH GFR
AG Ratio: 1.5 (calc) (ref 1.0–2.5)
ALT: 21 U/L (ref 9–46)
AST: 16 U/L (ref 10–40)
Albumin: 4.5 g/dL (ref 3.6–5.1)
Alkaline phosphatase (APISO): 107 U/L (ref 36–130)
BUN: 11 mg/dL (ref 7–25)
CO2: 32 mmol/L (ref 20–32)
Calcium: 9.7 mg/dL (ref 8.6–10.3)
Chloride: 103 mmol/L (ref 98–110)
Creat: 1.24 mg/dL (ref 0.60–1.24)
Globulin: 3 g/dL (ref 1.9–3.7)
Glucose, Bld: 91 mg/dL (ref 65–99)
Potassium: 4.2 mmol/L (ref 3.5–5.3)
Sodium: 141 mmol/L (ref 135–146)
Total Bilirubin: 0.6 mg/dL (ref 0.2–1.2)
Total Protein: 7.5 g/dL (ref 6.1–8.1)
eGFR: 84 mL/min/{1.73_m2} (ref >=60)

## 2023-10-02 LAB — IRON,TIBC AND FERRITIN PANEL
%SAT: 44 % (ref 20–48)
Ferritin: 25 ng/mL — ABNORMAL LOW (ref 38–380)
Iron: 135 ug/dL (ref 50–195)
TIBC: 304 ug/dL (ref 250–425)

## 2023-10-02 LAB — CBC WITH DIFFERENTIAL/PLATELET
Absolute Lymphocytes: 1546 {cells}/uL (ref 850–3900)
Absolute Monocytes: 287 {cells}/uL (ref 200–950)
Basophils Absolute: 29 {cells}/uL (ref 0–200)
Basophils Relative: 0.7 %
Eosinophils Absolute: 98 {cells}/uL (ref 15–500)
Eosinophils Relative: 2.4 %
HCT: 46.3 % (ref 38.5–50.0)
Hemoglobin: 14.3 g/dL (ref 13.2–17.1)
MCH: 23.8 pg — ABNORMAL LOW (ref 27.0–33.0)
MCHC: 30.9 g/dL — ABNORMAL LOW (ref 32.0–36.0)
MCV: 76.9 fL — ABNORMAL LOW (ref 80.0–100.0)
MPV: 10.4 fL (ref 7.5–12.5)
Monocytes Relative: 7 %
Neutro Abs: 2140 {cells}/uL (ref 1500–7800)
Neutrophils Relative %: 52.2 %
Platelets: 254 10*3/uL (ref 140–400)
RBC: 6.02 10*6/uL — ABNORMAL HIGH (ref 4.20–5.80)
RDW: 13.8 % (ref 11.0–15.0)
Total Lymphocyte: 37.7 %
WBC: 4.1 10*3/uL (ref 3.8–10.8)

## 2023-10-02 LAB — VITAMIN D 25 HYDROXY (VIT D DEFICIENCY, FRACTURES): Vit D, 25-Hydroxy: 49 ng/mL (ref 30–100)

## 2023-10-08 ENCOUNTER — Telehealth: Payer: Self-pay | Admitting: Psychiatry

## 2023-10-08 DIAGNOSIS — F900 Attention-deficit hyperactivity disorder, predominantly inattentive type: Secondary | ICD-10-CM

## 2023-10-08 DIAGNOSIS — R419 Unspecified symptoms and signs involving cognitive functions and awareness: Secondary | ICD-10-CM

## 2023-10-08 MED ORDER — LISDEXAMFETAMINE DIMESYLATE 60 MG PO CAPS: 60.0000 mg | ORAL_CAPSULE | ORAL | 0 refills | Status: AC

## 2023-10-08 MED ORDER — LISDEXAMFETAMINE DIMESYLATE 60 MG PO CAPS: 60.0000 mg | ORAL_CAPSULE | ORAL | 0 refills | Status: DC

## 2023-10-08 NOTE — Telephone Encounter (Signed)
Received message requesting that Vyvanse be changed to generic Lisdexamphetamine due to insurance reasons. Vyvanse last filled 09/28/23.

## 2023-12-16 DIAGNOSIS — R29818 Other symptoms and signs involving the nervous system: Secondary | ICD-10-CM | POA: Diagnosis not present

## 2023-12-16 DIAGNOSIS — S069XAA Unspecified intracranial injury with loss of consciousness status unknown, initial encounter: Secondary | ICD-10-CM | POA: Diagnosis not present

## 2023-12-16 DIAGNOSIS — X58XXXA Exposure to other specified factors, initial encounter: Secondary | ICD-10-CM | POA: Diagnosis not present

## 2023-12-16 DIAGNOSIS — H53032 Strabismic amblyopia, left eye: Secondary | ICD-10-CM | POA: Diagnosis not present

## 2023-12-16 DIAGNOSIS — H53022 Refractive amblyopia, left eye: Secondary | ICD-10-CM | POA: Diagnosis not present

## 2023-12-16 DIAGNOSIS — H5213 Myopia, bilateral: Secondary | ICD-10-CM | POA: Diagnosis not present

## 2024-02-12 ENCOUNTER — Telehealth: Payer: Self-pay | Admitting: Psychiatry

## 2024-02-12 NOTE — Telephone Encounter (Signed)
 Mom called and asked for a refill on her son's generic vyvanse  60 mg. Pharmacy is  [facility] [address]. He has an appt with teresa on 5/17

## 2024-02-12 NOTE — Telephone Encounter (Signed)
 LF 4/8, due 5/6

## 2024-02-13 NOTE — Telephone Encounter (Signed)
 Mom notified that Rx was not due until 5/6.  Mom is asking that Rx be sent so they can order it from warehouse or if they need to find at a different pharmacy.

## 2024-02-14 ENCOUNTER — Telehealth: Payer: Self-pay

## 2024-02-14 NOTE — Telephone Encounter (Signed)
 Prior Authorization submitted for Vyvanse  60 mg capsules with [employer]

## 2024-02-15 NOTE — Telephone Encounter (Signed)
 PA approved through 10/21/24.

## 2024-02-26 ENCOUNTER — Other Ambulatory Visit: Payer: Self-pay

## 2024-02-26 DIAGNOSIS — R419 Unspecified symptoms and signs involving cognitive functions and awareness: Secondary | ICD-10-CM

## 2024-02-26 DIAGNOSIS — F900 Attention-deficit hyperactivity disorder, predominantly inattentive type: Secondary | ICD-10-CM

## 2024-02-26 MED ORDER — LISDEXAMFETAMINE DIMESYLATE 60 MG PO CAPS
60.0000 mg | ORAL_CAPSULE | ORAL | 0 refills | Status: DC
Start: 2024-02-26 — End: 2024-03-09

## 2024-02-26 NOTE — Telephone Encounter (Signed)
 Pended generic Vyvanse  to [facility]

## 2024-03-09 ENCOUNTER — Ambulatory Visit: Payer: Medicare HMO | Admitting: Physician Assistant

## 2024-03-09 ENCOUNTER — Encounter: Payer: Self-pay | Admitting: Physician Assistant

## 2024-03-09 DIAGNOSIS — R419 Unspecified symptoms and signs involving cognitive functions and awareness: Secondary | ICD-10-CM | POA: Diagnosis not present

## 2024-03-09 DIAGNOSIS — F900 Attention-deficit hyperactivity disorder, predominantly inattentive type: Secondary | ICD-10-CM | POA: Diagnosis not present

## 2024-03-09 MED ORDER — VYVANSE 60 MG PO CAPS: 60.0000 mg | ORAL_CAPSULE | ORAL | 0 refills | Status: DC

## 2024-03-09 NOTE — Progress Notes (Signed)
 [facility]  Patient ID: Jonathan Moody,  MRN: [id number]  PCP: Sowles, Krichna, MD  Date of Evaluation: 03/09/2024 Time spent:25 minutes  Chief Complaint:  Chief Complaint   Follow-up    HISTORY/CURRENT STATUS: HPI transferring to my care from Roselyn Connor, NP who is no longer with practice.  His mom, Jonathan Moody, is with him.  Jonathan Moody is doing well on the Lamictal  and Vyvanse .  He goes to a day program 4 days a week and enjoys that.  He likes to play basketball, football and some days they go bowling.  He will be competing in the [place] [employer] [place] in a few weeks and is excited about that.  Energy and motivation are good.  His mom states he does not cry and has no obvious symptoms of depression.  He sleeps well.  ADLs and personal hygiene are normal.   Denies any changes in concentration, making decisions, or remembering things.  Appetite has not changed.  Weight is stable.  His mom states he does not seem anxious.  Since being on the Vyvanse  his behaviors have been more regulated.  No mania, delusions, or psychosis.  Denies suicidal or homicidal thoughts.  Denies dizziness, syncope, seizures, numbness, tingling, tremor, tics, unsteady gait, slurred speech, confusion. Denies muscle or joint pain, stiffness, or dystonia.  Individual Medical History/ Review of Systems: Changes? :No   Past Psychiatric Medication Trials: Vyvanse - Helpful for focus and outbursts Lamictal     Was on other stimulants in the past  Allergies: Patient has no known allergies.  Current Medications:  Current Outpatient Medications:    DOCUSATE SODIUM PO, Take by mouth daily., Disp: , Rfl:    ferrous sulfate 324 MG TBEC, Take 324 mg by mouth daily., Disp: , Rfl:    lisdexamfetamine (VYVANSE ) 60 MG capsule, Take 1 capsule (60 mg total) by mouth every morning., Disp: 30 capsule, Rfl: 0   Multiple Vitamins-Minerals (MULTIVITAMIN ADULT PO), Take 1 tablet by mouth daily., Disp: , Rfl:     polyethylene glycol (MIRALAX / GLYCOLAX) 17 g packet, Take 17 g by mouth daily as needed for mild constipation., Disp: , Rfl:    Probiotic Product (PROBIOTIC PO), Take by mouth., Disp: , Rfl:    [START ON 03/26/2024] VYVANSE  60 MG capsule, Take 1 capsule (60 mg total) by mouth every morning., Disp: 30 capsule, Rfl: 0   lamoTRIgine  (LAMICTAL ) 100 MG tablet, Take 1 tablet (100 mg total) by mouth daily., Disp: 90 tablet, Rfl: 3   [START ON 04/23/2024] VYVANSE  60 MG capsule, Take 1 capsule (60 mg total) by mouth every morning., Disp: 30 capsule, Rfl: 0   [START ON 05/23/2024] VYVANSE  60 MG capsule, Take 1 capsule (60 mg total) by mouth every morning., Disp: 30 capsule, Rfl: 0 Medication Side Effects: none  Family Medical/ Social History: Changes? No  MENTAL HEALTH EXAM:  There were no vitals taken for this visit.There is no height or weight on file to calculate BMI.  General Appearance: Casual and Well Groomed  Eye Contact:  Good  Speech:  Clear and Coherent and Normal Rate  Volume:  Normal  Mood:  Euthymic  Affect:  Congruent  Thought Process:  Goal Directed and Descriptions of Associations: Circumstantial  Orientation:  Full (Time, Place, and Person)  Thought Content: Logical   Suicidal Thoughts:  No  Homicidal Thoughts:  No  Memory:  WNL  Judgement:  Fair  Insight:  Fair  Psychomotor Activity:  Normal  Concentration:  Concentration: Good and Attention Span: Good  Recall:  [place] of Knowledge: Good  Language: Good  Assets:  Desire for Improvement Financial Resources/Insurance Housing Social Support Transportation  ADL's:  Intact  Cognition: Impaired,  Mild  Prognosis:  Good   DIAGNOSES:    ICD-10-CM   1. Attention deficit hyperactivity disorder (ADHD), inattentive type, severe  F90.0 VYVANSE  60 MG capsule    VYVANSE  60 MG capsule    2. Neurocognitive disorder  R41.9      Receiving Psychotherapy: Yes   RECOMMENDATIONS:   PDMP reviewed.  Vyvanse   filled 02/25/2024. I  provided 25 minutes of face to face time during this encounter, including time spent before and after the visit in records review, medical decision making, counseling pertinent to today's visit, and charting.   He is stable on current medications so no changes need to be made.  Continue Lamictal  100 mg, 1 p.o. daily. Continue Vyvanse  60 mg, 1 p.o. every morning.  (Brand necessary was approved within the past year, prescription was written for that but substitution is fine as long as it is available.  I think the generic was not available at 1 point.) Continue therapy. Return in 6 months.  Marvia Slocumb, PA-C

## 2024-04-06 ENCOUNTER — Encounter: Payer: Self-pay | Admitting: Family Medicine

## 2024-04-06 ENCOUNTER — Ambulatory Visit: Payer: Self-pay | Admitting: Family Medicine

## 2024-04-06 VITALS — BP 128/74 | HR 81 | Resp 16 | Ht 69.5 in | Wt 183.2 lb

## 2024-04-06 DIAGNOSIS — F02818 Dementia in other diseases classified elsewhere, unspecified severity, with other behavioral disturbance: Secondary | ICD-10-CM

## 2024-04-06 DIAGNOSIS — E611 Iron deficiency: Secondary | ICD-10-CM | POA: Diagnosis not present

## 2024-04-06 DIAGNOSIS — S069XAS Unspecified intracranial injury with loss of consciousness status unknown, sequela: Secondary | ICD-10-CM

## 2024-04-06 DIAGNOSIS — F900 Attention-deficit hyperactivity disorder, predominantly inattentive type: Secondary | ICD-10-CM

## 2024-04-06 DIAGNOSIS — R011 Cardiac murmur, unspecified: Secondary | ICD-10-CM | POA: Diagnosis not present

## 2024-04-06 DIAGNOSIS — R569 Unspecified convulsions: Secondary | ICD-10-CM | POA: Diagnosis not present

## 2024-04-06 DIAGNOSIS — G8190 Hemiplegia, unspecified affecting unspecified side: Secondary | ICD-10-CM
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
## 2024-04-07 LAB — IRON,TIBC AND FERRITIN PANEL
%SAT: 53 % — ABNORMAL HIGH (ref 20–48)
Ferritin: 26 ng/mL — ABNORMAL LOW (ref 38–380)
Iron: 155 ug/dL (ref 50–195)
TIBC: 290 ug/dL (ref 250–425)

## 2024-04-07 LAB — CBC WITH DIFFERENTIAL/PLATELET
Absolute Lymphocytes: 1517 {cells}/uL (ref 850–3900)
Absolute Monocytes: 331 {cells}/uL (ref 200–950)
Basophils Absolute: 38 {cells}/uL (ref 0–200)
Basophils Relative: 0.8 %
Eosinophils Absolute: 130 {cells}/uL (ref 15–500)
Eosinophils Relative: 2.7 %
HCT: 45.8 % (ref 38.5–50.0)
Hemoglobin: 13.6 g/dL (ref 13.2–17.1)
MCH: 23.7 pg — ABNORMAL LOW (ref 27.0–33.0)
MCHC: 29.7 g/dL — ABNORMAL LOW (ref 32.0–36.0)
MCV: 79.9 fL — ABNORMAL LOW (ref 80.0–100.0)
MPV: 11.3 fL (ref 7.5–12.5)
Monocytes Relative: 6.9 %
Neutro Abs: 2784 {cells}/uL (ref 1500–7800)
Neutrophils Relative %: 58 %
Platelets: 260 10*3/uL (ref 140–400)
RBC: 5.73 10*6/uL (ref 4.20–5.80)
RDW: 13.2 % (ref 11.0–15.0)
Total Lymphocyte: 31.6 %
WBC: 4.8 10*3/uL (ref 3.8–10.8)

## 2024-04-08 ENCOUNTER — Ambulatory Visit: Payer: Self-pay | Admitting: Family Medicine

## 2024-04-08 DIAGNOSIS — E611 Iron deficiency: Secondary | ICD-10-CM | POA: Insufficient documentation

## 2024-05-04 ENCOUNTER — Ambulatory Visit (HOSPITAL_BASED_OUTPATIENT_CLINIC_OR_DEPARTMENT_OTHER)

## 2024-05-04 DIAGNOSIS — R011 Cardiac murmur, unspecified: Secondary | ICD-10-CM

## 2024-05-04 LAB — ECHOCARDIOGRAM COMPLETE
Area-P 1/2: 3.27 cm2
S' Lateral: 3.22 cm

## 2024-06-08 ENCOUNTER — Encounter: Admitting: Oncology

## 2024-06-15 ENCOUNTER — Encounter: Payer: Self-pay | Admitting: Oncology

## 2024-06-15 ENCOUNTER — Inpatient Hospital Stay

## 2024-06-15 ENCOUNTER — Inpatient Hospital Stay: Attending: Oncology | Admitting: Oncology

## 2024-06-15 VITALS — BP 138/86 | HR 61 | Temp 97.0°F | Resp 18 | Wt 181.4 lb

## 2024-06-15 DIAGNOSIS — R718 Other abnormality of red blood cells: Secondary | ICD-10-CM | POA: Insufficient documentation

## 2024-06-15 DIAGNOSIS — E611 Iron deficiency: Secondary | ICD-10-CM | POA: Diagnosis not present

## 2024-06-15 NOTE — Assessment & Plan Note (Signed)
 Pending above work up
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
## 2024-06-15 NOTE — Assessment & Plan Note (Signed)
 I recommend patient to hold off taking iron supplementation.  Check labs next week Cbc iron tibc ferritin retic panel, hemoglobinopathy evaluation.

## 2024-06-29 ENCOUNTER — Inpatient Hospital Stay: Attending: Oncology

## 2024-06-29 DIAGNOSIS — R718 Other abnormality of red blood cells: Secondary | ICD-10-CM | POA: Diagnosis not present

## 2024-06-29 DIAGNOSIS — Z79899 Other long term (current) drug therapy: Secondary | ICD-10-CM | POA: Diagnosis not present

## 2024-06-29 DIAGNOSIS — E611 Iron deficiency: Secondary | ICD-10-CM | POA: Diagnosis not present

## 2024-06-29 LAB — CBC WITH DIFFERENTIAL/PLATELET
Abs Immature Granulocytes: 0.01 K/uL (ref 0.00–0.07)
Basophils Absolute: 0 K/uL (ref 0.0–0.1)
Basophils Relative: 1 %
Eosinophils Absolute: 0.2 K/uL (ref 0.0–0.5)
Eosinophils Relative: 5 %
HCT: 44.6 % (ref 39.0–52.0)
Hemoglobin: 13.5 g/dL (ref 13.0–17.0)
Immature Granulocytes: 0 %
Lymphocytes Relative: 35 %
Lymphs Abs: 1.5 K/uL (ref 0.7–4.0)
MCH: 23.6 pg — ABNORMAL LOW (ref 26.0–34.0)
MCHC: 30.3 g/dL (ref 30.0–36.0)
MCV: 78.1 fL — ABNORMAL LOW (ref 80.0–100.0)
Monocytes Absolute: 0.3 K/uL (ref 0.1–1.0)
Monocytes Relative: 8 %
Neutro Abs: 2.3 K/uL (ref 1.7–7.7)
Neutrophils Relative %: 51 %
Platelets: 214 K/uL (ref 150–400)
RBC: 5.71 MIL/uL (ref 4.22–5.81)
RDW: 13 % (ref 11.5–15.5)
WBC: 4.4 K/uL (ref 4.0–10.5)
nRBC: 0 % (ref 0.0–0.2)

## 2024-06-29 LAB — COMPREHENSIVE METABOLIC PANEL WITH GFR
ALT: 21 U/L (ref 0–44)
AST: 20 U/L (ref 15–41)
Albumin: 4.3 g/dL (ref 3.5–5.0)
Alkaline Phosphatase: 91 U/L (ref 38–126)
Anion gap: 10 (ref 5–15)
BUN: 15 mg/dL (ref 6–20)
CO2: 27 mmol/L (ref 22–32)
Calcium: 9.2 mg/dL (ref 8.9–10.3)
Chloride: 101 mmol/L (ref 98–111)
Creatinine, Ser: 1.27 mg/dL — ABNORMAL HIGH (ref 0.61–1.24)
GFR, Estimated: >60 mL/min (ref >60)
Glucose, Bld: 85 mg/dL (ref 70–99)
Potassium: 3.9 mmol/L (ref 3.5–5.1)
Sodium: 138 mmol/L (ref 135–145)
Total Bilirubin: 0.6 mg/dL (ref 0.0–1.2)
Total Protein: 7.4 g/dL (ref 6.5–8.1)

## 2024-06-29 LAB — RETIC PANEL
Immature Retic Fract: 9.1 % (ref 2.3–15.9)
RBC.: 5.69 MIL/uL (ref 4.22–5.81)
Retic Count, Absolute: 67.7 K/uL (ref 19.0–186.0)
Retic Ct Pct: 1.2 % (ref 0.4–3.1)
Reticulocyte Hemoglobin: 26.3 pg — ABNORMAL LOW (ref >27.9)

## 2024-06-29 LAB — IRON AND TIBC
Iron: 62 ug/dL (ref 45–182)
Saturation Ratios: 19 % (ref 17.9–39.5)
TIBC: 321 ug/dL (ref 250–450)
UIBC: 259 ug/dL

## 2024-06-29 LAB — FERRITIN: Ferritin: 31 ng/mL (ref 24–336)

## 2024-07-06 ENCOUNTER — Inpatient Hospital Stay (HOSPITAL_BASED_OUTPATIENT_CLINIC_OR_DEPARTMENT_OTHER): Admitting: Oncology

## 2024-07-06 ENCOUNTER — Encounter: Payer: Self-pay | Admitting: Oncology

## 2024-07-06 VITALS — BP 134/86 | HR 67 | Temp 97.1°F | Resp 18 | Wt 183.1 lb

## 2024-07-06 DIAGNOSIS — E611 Iron deficiency: Secondary | ICD-10-CM

## 2024-07-06 DIAGNOSIS — R718 Other abnormality of red blood cells: Secondary | ICD-10-CM

## 2024-07-06 NOTE — Assessment & Plan Note (Signed)
 lab results reviewed and discussed patient. Currently hemoglobin is normal.  Iron saturation normal.  Ferritin level normal. I recommend patient to hold off taking iron supplementation.  I suspect that he may have hemoglobinopathy evaluation.  Will check at the next visit.
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
## 2024-07-06 NOTE — Assessment & Plan Note (Signed)
 Lab Results  Component Value Date   HGB 13.5 06/29/2024   TIBC 321 06/29/2024   IRONPCTSAT 19 06/29/2024   FERRITIN 31 06/29/2024    Iron deficiency has resolved.  Recommend patient to stay off oral iron supplementation.  Will recheck iron panel in 3 months.

## 2024-07-20 ENCOUNTER — Inpatient Hospital Stay: Admitting: Oncology

## 2024-07-27 ENCOUNTER — Telehealth: Payer: Self-pay | Admitting: Physician Assistant

## 2024-07-27 ENCOUNTER — Other Ambulatory Visit: Payer: Self-pay

## 2024-07-27 DIAGNOSIS — R419 Unspecified symptoms and signs involving cognitive functions and awareness: Secondary | ICD-10-CM

## 2024-07-27 DIAGNOSIS — F900 Attention-deficit hyperactivity disorder, predominantly inattentive type: Secondary | ICD-10-CM

## 2024-07-27 MED ORDER — VYVANSE 60 MG PO CAPS: 60.0000 mg | ORAL_CAPSULE | ORAL | 0 refills | Status: DC

## 2024-07-27 MED ORDER — VYVANSE 60 MG PO CAPS: 60.0000 mg | ORAL_CAPSULE | ORAL | 0 refills | Status: AC

## 2024-07-27 NOTE — Telephone Encounter (Signed)
 Pt need rf of Vyvanse    Appt 11/17    [facility] on [address]

## 2024-07-27 NOTE — Telephone Encounter (Signed)
 Pended

## 2024-09-07 ENCOUNTER — Encounter: Payer: Self-pay | Admitting: Physician Assistant

## 2024-09-07 ENCOUNTER — Ambulatory Visit: Admitting: Physician Assistant

## 2024-09-07 DIAGNOSIS — F900 Attention-deficit hyperactivity disorder, predominantly inattentive type: Secondary | ICD-10-CM

## 2024-09-07 DIAGNOSIS — R419 Unspecified symptoms and signs involving cognitive functions and awareness: Secondary | ICD-10-CM

## 2024-09-07 MED ORDER — VYVANSE 60 MG PO CAPS: 60.0000 mg | ORAL_CAPSULE | ORAL | 0 refills | Status: AC

## 2024-09-07 MED ORDER — LAMOTRIGINE 100 MG PO TABS: 100.0000 mg | ORAL_TABLET | Freq: Every day | ORAL | 3 refills | Status: AC

## 2024-09-07 NOTE — Progress Notes (Signed)
 [facility]  Patient ID: YEHUDA PRINTUP,  MRN: [id number]  PCP: Sowles, Krichna, MD  Date of Evaluation: 09/07/2024 Time spent:20 minutes  Chief Complaint:  Chief Complaint   ADHD; Follow-up    HISTORY/CURRENT STATUS: HPI  Barnie (grandmother but has raised him, he calls her Mom.)  He's doing well with current meds. Patient is able to enjoy things.  Energy and motivation are good.  No extreme sadness, tearfulness, or feelings of hopelessness.  Sleeps well most of the time. ADLs and personal hygiene are normal.   No changes in concentration, making decisions, or remembering things.  Appetite has not changed.  Weight is stable.   No mania, delirium, AH/VH.  No SI/HI.  Individual Medical History/ Review of Systems: Changes? :No   Past Psychiatric Medication Trials: Vyvanse - Helpful for focus and outbursts Lamictal     Was on other stimulants in the past  Allergies: Patient has no known allergies.  Current Medications:  Current Outpatient Medications:    DOCUSATE SODIUM PO, Take by mouth daily., Disp: , Rfl:    lisdexamfetamine (VYVANSE ) 60 MG capsule, Take 1 capsule (60 mg total) by mouth every morning., Disp: 30 capsule, Rfl: 0   Multiple Vitamins-Minerals (MULTIVITAMIN ADULT PO), Take 1 tablet by mouth daily., Disp: , Rfl:    polyethylene glycol (MIRALAX / GLYCOLAX) 17 g packet, Take 17 g by mouth daily as needed for mild constipation., Disp: , Rfl:    Probiotic Product (PROBIOTIC PO), Take by mouth., Disp: , Rfl:    VYVANSE  60 MG capsule, Take 1 capsule (60 mg total) by mouth every morning., Disp: 30 capsule, Rfl: 0   [START ON 09/23/2024] VYVANSE  60 MG capsule, Take 1 capsule (60 mg total) by mouth every morning., Disp: 30 capsule, Rfl: 0   ferrous sulfate 324 MG TBEC, Take 324 mg by mouth daily. (Patient not taking: Reported on 07/06/2024), Disp: , Rfl:    lamoTRIgine  (LAMICTAL ) 100 MG tablet, Take 1 tablet (100 mg total) by mouth daily., Disp: 90 tablet,  Rfl: 3   [START ON 10/23/2024] VYVANSE  60 MG capsule, Take 1 capsule (60 mg total) by mouth every morning., Disp: 30 capsule, Rfl: 0   [START ON 11/22/2024] VYVANSE  60 MG capsule, Take 1 capsule (60 mg total) by mouth every morning., Disp: 30 capsule, Rfl: 0 Medication Side Effects: none  Family Medical/ Social History: Changes? No  MENTAL HEALTH EXAM:  There were no vitals taken for this visit.There is no height or weight on file to calculate BMI.  General Appearance: Casual and Well Groomed  Eye Contact:  Good  Speech:  Clear and Coherent and Normal Rate  Volume:  Normal  Mood:  Euthymic  Affect:  Congruent  Thought Process:  Goal Directed and Descriptions of Associations: Circumstantial  Orientation:  Full (Time, Place, and Person)  Thought Content: Logical   Suicidal Thoughts:  No  Homicidal Thoughts:  No  Memory:  WNL  Judgement:  Fair  Insight:  Fair  Psychomotor Activity:  Normal  Concentration:  Concentration: Good and Attention Span: Good  Recall:  Good  Fund of Knowledge: Good  Language: Good  Assets:  Desire for Improvement Financial Resources/Insurance Housing Social Support  ADL's:  Intact  Cognition: Impaired,  Mild  Prognosis:  Good   DIAGNOSES:    ICD-10-CM   1. Attention deficit hyperactivity disorder (ADHD), inattentive type, severe  F90.0 VYVANSE  60 MG capsule    2. Neurocognitive disorder  R41.9 lamoTRIgine  (LAMICTAL ) 100 MG tablet  Receiving Psychotherapy: Yes   RECOMMENDATIONS:   PDMP reviewed.  Vyvanse   filled 08/25/2024. I provided approximately 20 minutes of face to face time during this encounter, including time spent before and after the visit in records review, medical decision making, counseling pertinent to today's visit, and charting.   He's doing well so no changes are needed.   Continue Lamictal  100 mg, 1 p.o. daily. Continue Vyvanse  60 mg, 1 p.o. every morning.  (Brand necessary has been approved. Continue therapy. Return in 6  months.  Verneita Cooks, PA-C

## 2024-09-28 ENCOUNTER — Inpatient Hospital Stay: Attending: Oncology

## 2024-09-28 DIAGNOSIS — D509 Iron deficiency anemia, unspecified: Secondary | ICD-10-CM | POA: Diagnosis present

## 2024-09-28 DIAGNOSIS — R718 Other abnormality of red blood cells: Secondary | ICD-10-CM

## 2024-09-28 DIAGNOSIS — Z79899 Other long term (current) drug therapy: Secondary | ICD-10-CM | POA: Insufficient documentation

## 2024-09-28 LAB — CBC WITH DIFFERENTIAL (CANCER CENTER ONLY)
Abs Immature Granulocytes: 0.01 K/uL (ref 0.00–0.07)
Basophils Absolute: 0 K/uL (ref 0.0–0.1)
Basophils Relative: 1 %
Eosinophils Absolute: 0.1 K/uL (ref 0.0–0.5)
Eosinophils Relative: 2 %
HCT: 46.4 % (ref 39.0–52.0)
Hemoglobin: 14.2 g/dL (ref 13.0–17.0)
Immature Granulocytes: 0 %
Lymphocytes Relative: 35 %
Lymphs Abs: 1.8 K/uL (ref 0.7–4.0)
MCH: 23.7 pg — ABNORMAL LOW (ref 26.0–34.0)
MCHC: 30.6 g/dL (ref 30.0–36.0)
MCV: 77.6 fL — ABNORMAL LOW (ref 80.0–100.0)
Monocytes Absolute: 0.5 K/uL (ref 0.1–1.0)
Monocytes Relative: 9 %
Neutro Abs: 2.6 K/uL (ref 1.7–7.7)
Neutrophils Relative %: 53 %
Platelet Count: 229 K/uL (ref 150–400)
RBC: 5.98 MIL/uL — ABNORMAL HIGH (ref 4.22–5.81)
RDW: 12.8 % (ref 11.5–15.5)
WBC Count: 5 K/uL (ref 4.0–10.5)
nRBC: 0 % (ref 0.0–0.2)

## 2024-09-28 LAB — IRON AND TIBC
Iron: 106 ug/dL (ref 45–182)
Saturation Ratios: 32 % (ref 17.9–39.5)
TIBC: 333 ug/dL (ref 250–450)
UIBC: 227 ug/dL

## 2024-09-28 LAB — FERRITIN: Ferritin: 39 ng/mL (ref 24–336)

## 2024-09-30 LAB — HGB FRACTIONATION CASCADE
Hgb A2: 2.4 % (ref 1.8–3.2)
Hgb A: 97.6 % (ref 96.4–98.8)
Hgb F: 0 % (ref 0.0–2.0)
Hgb S: 0 %

## 2024-10-05 ENCOUNTER — Encounter: Payer: Self-pay | Admitting: Oncology

## 2024-10-05 ENCOUNTER — Inpatient Hospital Stay: Admitting: Oncology

## 2024-10-05 VITALS — BP 130/74 | HR 57 | Temp 97.6°F | Resp 20 | Wt 183.6 lb

## 2024-10-05 DIAGNOSIS — D509 Iron deficiency anemia, unspecified: Secondary | ICD-10-CM | POA: Diagnosis not present

## 2024-10-05 DIAGNOSIS — R718 Other abnormality of red blood cells: Secondary | ICD-10-CM

## 2024-10-05 NOTE — Assessment & Plan Note (Signed)
 lab results reviewed and discussed patient. Currently hemoglobin is normal.  Iron saturation normal.  Ferritin level normal. No role of iron supplementation.  Negative hemoglobinopathy profile.  Alpha-thalassemia analysis is pending.
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
## 2024-10-07 LAB — ALPHA-THALASSEMIA ANALYSIS: IMAGE: 0

## 2024-10-11 ENCOUNTER — Ambulatory Visit: Payer: Self-pay | Admitting: Oncology

## 2024-10-12 ENCOUNTER — Encounter: Payer: Self-pay | Admitting: Family Medicine

## 2024-10-12 ENCOUNTER — Ambulatory Visit: Admitting: Family Medicine

## 2024-10-12 VITALS — BP 124/74 | HR 76 | Resp 16 | Ht 69.5 in | Wt 181.9 lb

## 2024-10-12 DIAGNOSIS — D563 Thalassemia minor: Secondary | ICD-10-CM

## 2024-10-12 DIAGNOSIS — R569 Unspecified convulsions: Secondary | ICD-10-CM

## 2024-10-12 DIAGNOSIS — G8190 Hemiplegia, unspecified affecting unspecified side: Secondary | ICD-10-CM

## 2024-10-12 DIAGNOSIS — F900 Attention-deficit hyperactivity disorder, predominantly inattentive type: Secondary | ICD-10-CM | POA: Diagnosis not present

## 2024-10-12 DIAGNOSIS — F02818 Dementia in other diseases classified elsewhere, unspecified severity, with other behavioral disturbance: Secondary | ICD-10-CM | POA: Diagnosis not present

## 2024-10-12 DIAGNOSIS — Z23 Encounter for immunization: Secondary | ICD-10-CM

## 2024-10-12 DIAGNOSIS — S0990XS Unspecified injury of head, sequela: Secondary | ICD-10-CM

## 2024-10-12 DIAGNOSIS — S069XAS Unspecified intracranial injury with loss of consciousness status unknown, sequela: Secondary | ICD-10-CM | POA: Diagnosis not present

## 2024-10-12 NOTE — Progress Notes (Signed)
 Name: Jonathan Moody   MRN: [id number]    DOB: Feb 11, 2001   Date:10/12/2024       Progress Note  Subjective  Chief Complaint  Chief Complaint  Patient presents with   Medical Management of Chronic Issues   Discussed the use of AI scribe software for clinical note transcription with the patient, who gave verbal consent to proceed.  History of Present Illness Jonathan Moody is a 23 year old male with a history of alpha thalassemia trait, traumatic brain injury, hemiparesis, major neurocognitive disorder, ADHD, and seizures who presents for a follow-up visit. He is accompanied by his mother.  He has a history of alpha thalassemia trait, resulting in chronic anemia. His iron levels remain stable without supplementation, and he maintains a balanced diet to support his iron levels.  At ten months of age, he sustained a traumatic brain injury, leading to hemiparesis on the left side and major neurocognitive disorder. He experiences limitations in the movement of his left wrist, elbow, and fingers, with the wrist being the most affected. Despite these physical limitations, he actively participates in sports such as basketball and flag football.  He has a history of seizures, with the last episode occurring at 23 years of age. He is currently on lamotrigine  for seizure control and has not experienced any seizures since starting the medication. He also takes Vyvanse  60 mg for ADHD management.  He participates in a day program that keeps him active and engaged, contributing to weight loss and overall well-being. He enjoys activities such as basketball and golf, and his weight has decreased from 183 lbs to 181 lbs over the past six months.    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   RBBB (right bundle branch block) 11/28/2017   History of strabismus surgery 06/05/2017   Myopic astigmatism, bilateral 06/05/2017   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   Development delay 05/29/2012    Past Surgical History:  Procedure Laterality Date   STRABISMUS SURGERY Left    at [facility]    Family History  Problem Relation Age of Onset   Hypertension Maternal Grandmother    Osteoarthritis Maternal Grandfather    Hypertension Maternal Grandfather    Epilepsy Maternal Grandfather     Social History   Tobacco Use   Smoking status: Never   Smokeless tobacco: Never  Substance Use Topics   Alcohol use: No    Current Medications[1]  Allergies[2]  I personally reviewed active problem list, medication list, allergies, family history with the patient/caregiver today.   ROS  Ten systems reviewed and is negative except as mentioned in HPI    Objective Physical Exam MEASUREMENTS: Weight- 181. CONSTITUTIONAL: Patient appears well-developed and well-nourished. No distress. HEENT: Head atraumatic, normocephalic, neck supple. CARDIOVASCULAR: Normal rate, regular rhythm and normal heart sounds. No murmur heard. No BLE edema. PULMONARY: Effort normal and breath sounds normal. No respiratory distress. ABDOMINAL: There is no tenderness or distention. MUSCULOSKELETAL: Normal gait. Without gross motor or sensory deficit. Left  elbow limited flexion. Left wrist severely limited mobility. Left fingers limited function. PSYCHIATRIC: Patient has a normal mood and affect. Behavior is normal. Judgment and thought content normal.  Vitals:   10/12/24 1111  BP: 124/74   Pulse: 76  Resp: 16  SpO2: 97%  Weight: 181 lb 14.4 oz (82.5 kg)  Height: 5' 9.5 (1.765 m)    Body mass index is 26.48 kg/m.  Recent Results (from the past 2160 hours)  Alpha-Thalassemia Analysis     Status: Abnormal   Collection Time: 09/28/24 11:41 AM  Result Value Ref Range   Source CRE BLOOD    Specimen Type Comment     Comment: Whole Blood   Indication microcytosis     Comment: Performed at [facility], [address], [place], [place] [address]   Indication Comment     Comment: Not Provided   Result Comment (A)     Comment: POSITIVE   Interpretation Comment     Comment: (NOTE) Variants Detected Disorders (Gene)    Result           Interpretation Alpha-thalassemia   POSITIVE :       Predicted to be a HBA1/HBA2 16p13.3   CARRIER:         carrier of                    Homozygous for   alpha-thalassemia.                    the -alpha3.7    Individuals with                    deletion.        this result have                    (-alpha/-alpha)  alpha-+-thalassemia                                     trait. May have                                     symptoms of mild                                     anemia. Genetic                                     counseling is                                     recommended.    Recommendations Comment     Comment: (NOTE) If the above result is positive, genetic counseling is recommended to discuss the potential clinical and/or reproductive implications, as well as recommendations for testing family members and, when applicable, this individual's partner. Genetic counseling services are available. To access [facility] please visit [email or web address] or call [email or web address] [email or web address] [phone]).    Clinical Information Comment     Comment: (NOTE) Alpha-thalassemia, a disorder with variable severity, is usually inherited in an autosomal recessive manner. Individuals  with  alpha-thalassemia have a deficiency in the production of hemoglobin, a protein that carries oxygen in the blood. Silent carriers of alpha-thalassemia are not expected to have related health problems. Individuals with alpha-thalassemia trait may have symptoms of mild anemia. The two clinically significant forms of alpha thalassemia are HbH disease and Hb Bart hydrops fetalis syndrome. Signs and symptoms of the less severe HbH disease usually appear in early childhood and may include mild to moderate hemolytic anemia, hepatosplenomegaly, mild jaundice, and bone changes. Signs and symptoms of the more severe Hb Bart hydrops fetalis syndrome appear before birth and may include generalized edema, severe hypochromic anemia, hepatosplenomegaly, heart problems, and genitourinary abnormalities. Mothers of babies affected with Hb Bart hydrops feta lis syndrome may experience serious complications, including preeclampsia, premature delivery, or abnormal bleeding during pregnancy. Co-inheritance of alpha-thalassemia and other hemoglobinopathies, such as beta-thalassemia or sickle cell disease, may modify symptoms. In utero stem cell transplantation or fetal blood transfusions may be available for affected fetuses. Without treatment, most babies with Hb Bart hydrops fetalis syndrome are stillborn or die soon after birth. Individuals with HbH disease may survive to adulthood. Treatment is otherwise supportive and may include red blood cell transfusions. (EFPI:[id number]).    Comments Comment     Comment: (NOTE) This interpretation is based on the clinical information provided and the current understanding of the molecular genetics of the disorder(s) tested.    Methods/Limitations Comment     Comment: (NOTE) Alpha thalassemia: Analysis of the alpha-globin (HBA) gene cluster is performed by multiplex ligation-dependent probe amplification (MLPA). Agarose gel electrophoresis and Sanger sequencing  are used to distinguish or confirm variants that cannot be resolved by MLPA. There are two alpha-globin genes in the HBA gene cluster, HBA1 and HBA2. Typically, an individual with a normal genotype has these two genes on each chromosome (alpha alpha/alpha alpha). A deletion that removes two of the genes on one of the chromosomes is described as - -/alpha alpha. Alpha-globin variants included in the analysis are the [place] non-deletion variant and the following deletions: -alpha3.7, -alpha4.2, --alpha20.5, --SEA, --FIL, --THAI, --MED, and the HS-40 regulatory region. This analysis does not detect other variants in the alpha-globin genes or variants in the beta-globin gene and may not detect the co-occurrence of a deletion and a duplication. Analytical sensitivity is  estimated to be >98% for the targeted variants. Limitations: Technologies used do not detect germline mosaicism and do not rule out the presence of large chromosomal aberrations including rearrangements and gene fusions, or variants in regions or genes not included in this test, or possible inter/intragenic interactions between variants, or repeat expansions. Variant classification and/or interpretation may change over time if more information becomes available. False positive or false negative results may occur for reasons that include: rare genetic variants, sex chromosome abnormalities, pseudogene interference, blood transfusions, bone marrow transplantation, somatic or tissue-specific mosaicism, mislabeled samples, or erroneous representation of family relationships. This test was developed and its performance characteristics determined by [facility]. It has not been cleared or approved by the Food and Drug Administration. This test was developed and its performance  characteristics determined by [facility]. It has not been cleared or approved by the Food and Drug Administration.    References Comment      Comment: (NOTE) Tamary H, Dgany O. Alpha-Thalassemia. 2005 Nov 1 [Updated 2020 Oct 1]. In: Juliene POSNER, Jerrell JINNY Rhett CAFFIE, et al., editors. GeneReviews(R) [Internet]. PMID: 79698391    Genes Analyzed Comment     Comment: HBA1/HBA2  Director Review/Release Comment     Comment: (NOTE) Component Type       Performed At        Laboratory                                         Director Technical            Laboratory          Aniceto Christen, component,           Corporation of      MD, PhD processing           [place], [address],                     [place], [place],                     [id number] Technical            Laboratory          Aniceto Christen, component,           Corporation of      MD, PhD analysis             [place], [address]                     [address],                     [place], [place],                     [id number] Professional         Laboratory          Aniceto Christen, component            Corporation of      MD, PhD                     [place], [address]                     [place], [place],                     [id number] Electronically released by Larose Agent, PhD, [facility]    IMAGE .     Comment: (NOTE) Performed At: [facility] [address] [place], [place] [id number] Christen Aniceto MDPhD Ey:[id number]   Hgb Fractionation Cascade     Status: None   Collection Time: 09/28/24 11:41 AM  Result Value Ref Range   Hgb F 0.0 0.0 - 2.0 %   Hgb A 97.6 96.4 - 98.8 %   Hgb A2 2.4 1.8 - 3.2 %   Hgb S 0.0 0.0 %   Interpretation, Hgb Fract Comment     Comment: (NOTE) Normal hemoglobin present; no hemoglobin variant or beta thalassemia identified. Note: Alpha thalassemia may not be detected by the Hgb Fractionation Cascade panel. If alpha thalassemia is suspected, [facility] offers Alpha-Thalassemia Analysis [phone]). Performed At: [facility] [address] [place], [place] [id number] Jennette Shorter MD  Ey:[phone]  Ferritin     Status: None   Collection Time: 09/28/24 11:41 AM  Result Value Ref Range   Ferritin 39 24 - 336 ng/mL    Comment: Performed at [facility], [address] [place], [place] [address]  Iron and TIBC     Status: None   Collection Time: 09/28/24 11:41 AM  Result Value Ref Range   Iron 106 45 - 182 ug/dL   TIBC 666 749 - 549 ug/dL   Saturation Ratios 32 17.9 - 39.5 %   UIBC 227 ug/dL    Comment: Performed at [facility], [address] [place], [place] [address]  CBC with Differential (Cancer Center Only)     Status: Abnormal   Collection Time: 09/28/24 11:41 AM  Result Value Ref Range   WBC Count 5.0 4.0 - 10.5 K/uL   RBC 5.98 (H) 4.22 - 5.81 MIL/uL   Hemoglobin 14.2 13.0 - 17.0 g/dL   HCT 53.5 60.9 - 47.9 %   MCV 77.6 (L) 80.0 - 100.0 fL   MCH 23.7 (L) 26.0 - 34.0 pg   MCHC 30.6 30.0 - 36.0 g/dL   RDW 87.1 88.4 - 84.4 %   Platelet Count 229 150 - 400 K/uL   nRBC 0.0 0.0 - 0.2 %   Neutrophils Relative % 53 %   Neutro Abs 2.6 1.7 - 7.7 K/uL   Lymphocytes Relative 35 %   Lymphs Abs 1.8 0.7 - 4.0 K/uL   Monocytes Relative 9 %   Monocytes Absolute 0.5 0.1 - 1.0 K/uL   Eosinophils Relative 2 %   Eosinophils Absolute 0.1 0.0 - 0.5 K/uL   Basophils Relative 1 %   Basophils Absolute 0.0 0.0 - 0.1 K/uL   Immature Granulocytes 0 %   Abs Immature Granulocytes 0.01 0.00 - 0.07 K/uL    Comment: Performed at [facility], [address] [place], [place] [address]      PHQ2/9:    10/12/2024   11:06 AM 04/06/2024   10:32 AM 10/01/2023   10:56 AM 05/21/2023   11:31 AM 09/11/2022   10:00 AM  Depression screen PHQ 2/9  Decreased Interest 0 0 0 0 0  Down, Depressed, Hopeless 0 0 0 0 0  PHQ - 2 Score 0 0 0 0 0  Altered sleeping   0 0 0  Tired, decreased energy   0 0 0  Change in appetite   0 0 0  Feeling bad or failure about yourself    0 0 0  Trouble concentrating   0 0 0  Moving slowly or fidgety/restless   0 0 0   Suicidal thoughts   0 0 0  PHQ-9 Score   0  0  0   Difficult doing work/chores   Not difficult at all       Data saved with a previous flowsheet row definition    phq 9 is negative  Fall Risk:    10/12/2024   11:06 AM 04/06/2024   10:32 AM 10/01/2023   10:55 AM 05/21/2023   11:31 AM 09/11/2022   10:00 AM  Fall Risk   Falls in the past year? 0 0 0 0 0  Number falls in past yr: 0 0 0 0 0  Injury with Fall? 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 Limited wrist and elbow extension, some finger function. Active in sports, potentially beneficial for motor function. - Referred to occupational therapy at [facility] for left hand and arm function improvement.  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 No acute behavioral issues reported.  Seizure disorder, in remission Seizure disorder in remission since age 49. On lamotrigine  for behavioral issues and seizure control.  Attention deficit hyperactivity disorder, inattentive type ADHD, inattentive type, managed with Vyvanse  60 mg. No acute issues reported.  Alpha thalassemia minor trait No iron deficiency anemia. Normal iron levels without supplementation. - Maintain a high iron diet.  General health maintenance Routine health maintenance discussed. Recent labs normal, including kidney function,  vitamin D , cholesterol, and A1c. No blood work needed today. - Administered flu shot today. - Continue vitamin D  supplementation. - Schedule Medicare wellness visit for preventive care.        [1]  Current Outpatient Medications:    DOCUSATE SODIUM PO, Take by mouth daily., Disp: , Rfl:    lamoTRIgine  (LAMICTAL ) 100 MG tablet, Take 1 tablet (100 mg total) by mouth daily., Disp: 90 tablet, Rfl: 3   lisdexamfetamine  (VYVANSE ) 60 MG capsule, Take 1 capsule (60 mg total) by mouth every morning., Disp: 30 capsule, Rfl: 0   Multiple Vitamins-Minerals (MULTIVITAMIN ADULT PO), Take 1 tablet by mouth daily., Disp: , Rfl:    polyethylene glycol (MIRALAX / GLYCOLAX) 17 g packet, Take 17 g by mouth daily as needed for mild constipation., Disp: , Rfl:    Probiotic Product (PROBIOTIC PO), Take by mouth., Disp: , Rfl:    VYVANSE  60 MG capsule, Take 1 capsule (60 mg total) by mouth every morning., Disp: 30 capsule, Rfl: 0   [START ON 10/23/2024] VYVANSE  60 MG capsule, Take 1 capsule (60 mg total) by mouth every morning., Disp: 30 capsule, Rfl: 0   [START ON 11/22/2024] VYVANSE  60 MG capsule, Take 1 capsule (60 mg total) by mouth every morning., Disp: 30 capsule, Rfl: 0   VYVANSE  60 MG capsule, Take 1 capsule (60 mg total) by mouth every morning., Disp: 30 capsule, Rfl: 0   ferrous sulfate 324 MG TBEC, Take 324 mg by mouth daily. (Patient not taking: Reported on 10/12/2024), Disp: , Rfl:  [2] No Known Allergies

## 2024-10-12 NOTE — Progress Notes (Signed)
 Spoke to pt's guardian Barnie and notified her of lab work results for alpha thalassemia trait. Reviewed information with her.

## 2024-11-02 ENCOUNTER — Other Ambulatory Visit: Payer: Self-pay

## 2024-11-02 ENCOUNTER — Ambulatory Visit: Attending: Family Medicine | Admitting: Occupational Therapy

## 2024-11-02 DIAGNOSIS — R29818 Other symptoms and signs involving the nervous system: Secondary | ICD-10-CM | POA: Diagnosis present

## 2024-11-02 DIAGNOSIS — I69354 Hemiplegia and hemiparesis following cerebral infarction affecting left non-dominant side: Secondary | ICD-10-CM | POA: Insufficient documentation

## 2024-11-02 DIAGNOSIS — R278 Other lack of coordination: Secondary | ICD-10-CM | POA: Diagnosis present

## 2024-11-02 DIAGNOSIS — M6281 Muscle weakness (generalized): Secondary | ICD-10-CM | POA: Diagnosis present

## 2024-11-02 DIAGNOSIS — G8190 Hemiplegia, unspecified affecting unspecified side: Secondary | ICD-10-CM | POA: Insufficient documentation

## 2024-11-02 DIAGNOSIS — S0990XS Unspecified injury of head, sequela: Secondary | ICD-10-CM | POA: Diagnosis not present
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
## 2024-11-16 ENCOUNTER — Ambulatory Visit: Admitting: Occupational Therapy

## 2024-11-18 ENCOUNTER — Ambulatory Visit: Admitting: Occupational Therapy

## 2024-11-23 ENCOUNTER — Ambulatory Visit: Admitting: Occupational Therapy

## 2024-11-25 ENCOUNTER — Ambulatory Visit

## 2024-11-25 DIAGNOSIS — M6281 Muscle weakness (generalized): Secondary | ICD-10-CM

## 2024-11-25 DIAGNOSIS — I69354 Hemiplegia and hemiparesis following cerebral infarction affecting left non-dominant side: Secondary | ICD-10-CM

## 2024-11-25 DIAGNOSIS — R278 Other lack of coordination: Secondary | ICD-10-CM

## 2024-11-25 DIAGNOSIS — R29818 Other symptoms and signs involving the nervous system: Secondary | ICD-10-CM
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
## 2024-11-30 ENCOUNTER — Ambulatory Visit: Admitting: Occupational Therapy

## 2024-12-02 ENCOUNTER — Ambulatory Visit

## 2024-12-07 ENCOUNTER — Ambulatory Visit: Admitting: Occupational Therapy

## 2024-12-09 ENCOUNTER — Ambulatory Visit

## 2024-12-14 ENCOUNTER — Ambulatory Visit: Admitting: Occupational Therapy

## 2024-12-16 ENCOUNTER — Ambulatory Visit

## 2024-12-21 ENCOUNTER — Ambulatory Visit: Admitting: Occupational Therapy

## 2024-12-23 ENCOUNTER — Ambulatory Visit

## 2024-12-28 ENCOUNTER — Ambulatory Visit: Admitting: Occupational Therapy

## 2024-12-30 ENCOUNTER — Ambulatory Visit: Admitting: Occupational Therapy

## 2025-01-04 ENCOUNTER — Ambulatory Visit: Admitting: Occupational Therapy

## 2025-01-06 ENCOUNTER — Ambulatory Visit: Admitting: Occupational Therapy

## 2025-03-08 ENCOUNTER — Ambulatory Visit: Admitting: Physician Assistant
